# Patient Record
Sex: Female | Born: 1990 | Race: White | Hispanic: No | Smoking: Never smoker
Health system: Southern US, Community
[De-identification: ages and names within clinical notes are randomized; demographics above are authoritative.]

## PROBLEM LIST (undated history)

## (undated) DIAGNOSIS — M419 Scoliosis, unspecified: Secondary | ICD-10-CM

## (undated) DIAGNOSIS — Z3490 Encounter for supervision of normal pregnancy, unspecified, unspecified trimester: Secondary | ICD-10-CM

## (undated) DIAGNOSIS — F410 Panic disorder [episodic paroxysmal anxiety] without agoraphobia: Secondary | ICD-10-CM

## (undated) DIAGNOSIS — O24419 Gestational diabetes mellitus in pregnancy, unspecified control: Secondary | ICD-10-CM

## (undated) HISTORY — PX: OTHER SURGICAL HISTORY: SHX169

## (undated) HISTORY — DX: Scoliosis, unspecified: M41.9

## (undated) HISTORY — DX: Encounter for supervision of normal pregnancy, unspecified, unspecified trimester: Z34.90

## (undated) HISTORY — DX: Gestational diabetes mellitus in pregnancy, unspecified control: O24.419

---
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
## 2008-03-17 ENCOUNTER — Inpatient Hospital Stay (HOSPITAL_COMMUNITY): Admission: AD | Admit: 2008-03-17 | Discharge: 2008-03-17 | Payer: Self-pay | Admitting: Obstetrics & Gynecology

## 2008-03-26 ENCOUNTER — Inpatient Hospital Stay (HOSPITAL_COMMUNITY): Admission: AD | Admit: 2008-03-26 | Discharge: 2008-03-28 | Payer: Self-pay | Admitting: Obstetrics & Gynecology

## 2008-03-26 ENCOUNTER — Ambulatory Visit: Payer: Self-pay | Admitting: Advanced Practice Midwife

## 2008-08-16 ENCOUNTER — Emergency Department (HOSPITAL_COMMUNITY): Admission: EM | Admit: 2008-08-16 | Discharge: 2008-08-16 | Payer: Self-pay | Admitting: Emergency Medicine

## 2008-12-26 ENCOUNTER — Inpatient Hospital Stay (HOSPITAL_COMMUNITY): Admission: AD | Admit: 2008-12-26 | Discharge: 2008-12-26 | Payer: Self-pay | Admitting: Obstetrics & Gynecology

## 2009-07-07 ENCOUNTER — Ambulatory Visit: Payer: Self-pay | Admitting: Family

## 2009-07-07 ENCOUNTER — Inpatient Hospital Stay (HOSPITAL_COMMUNITY): Admission: AD | Admit: 2009-07-07 | Discharge: 2009-07-07 | Payer: Self-pay | Admitting: Obstetrics & Gynecology

## 2009-08-11 ENCOUNTER — Inpatient Hospital Stay (HOSPITAL_COMMUNITY): Admission: AD | Admit: 2009-08-11 | Discharge: 2009-08-15 | Payer: Self-pay | Admitting: Obstetrics and Gynecology

## 2009-08-11 ENCOUNTER — Ambulatory Visit: Payer: Self-pay | Admitting: Obstetrics and Gynecology

## 2009-08-12 DIAGNOSIS — O321XX0 Maternal care for breech presentation, not applicable or unspecified: Secondary | ICD-10-CM

## 2009-10-05 ENCOUNTER — Emergency Department (HOSPITAL_COMMUNITY): Admission: EM | Admit: 2009-10-05 | Discharge: 2009-10-05 | Payer: Self-pay | Admitting: Emergency Medicine

## 2009-10-08 ENCOUNTER — Emergency Department (HOSPITAL_COMMUNITY): Admission: EM | Admit: 2009-10-08 | Discharge: 2009-10-08 | Payer: Self-pay | Admitting: Emergency Medicine

## 2009-10-16 ENCOUNTER — Encounter: Payer: Self-pay | Admitting: Orthopedic Surgery

## 2009-11-12 ENCOUNTER — Emergency Department (HOSPITAL_COMMUNITY): Admission: EM | Admit: 2009-11-12 | Discharge: 2009-11-12 | Payer: Self-pay | Admitting: Emergency Medicine

## 2010-01-01 ENCOUNTER — Emergency Department (HOSPITAL_COMMUNITY): Admission: EM | Admit: 2010-01-01 | Discharge: 2010-01-01 | Payer: Self-pay | Admitting: Emergency Medicine

## 2010-05-05 NOTE — Letter (Signed)
Summary: *Orthopedic No Show Letter  [facility]  [address]  [place], [place] [address]   Phone: [phone]  Fax: [phone]      10/16/2009   [name] [address] [place], [place]  [address]      Dear [name],   Our records indicate that you missed your scheduled appointment with Dr. Beaulah Corin on 10/13/2009, following [name] Emergency [facility] visit.  Please contact this office to reschedule your appointment as soon as possible.  It is important that you keep your scheduled appointments with your physician, so we can provide you the best care possible.        Sincerely,    Dr. Terrance Mass, MD [facility] Phone [phone]

## 2010-06-18 LAB — POCT PREGNANCY, URINE: Preg Test, Ur: POSITIVE

## 2010-06-18 LAB — URINALYSIS, ROUTINE W REFLEX MICROSCOPIC
Bilirubin Urine: NEGATIVE
Hgb urine dipstick: NEGATIVE
Protein, ur: NEGATIVE mg/dL
Urobilinogen, UA: 0.2 mg/dL (ref 0.0–1.0)

## 2010-06-19 LAB — URINALYSIS, ROUTINE W REFLEX MICROSCOPIC
Bilirubin Urine: NEGATIVE
Glucose, UA: NEGATIVE mg/dL
Ketones, ur: NEGATIVE mg/dL
Nitrite: NEGATIVE
Protein, ur: NEGATIVE mg/dL
pH: 6 (ref 5.0–8.0)

## 2010-06-19 LAB — GC/CHLAMYDIA PROBE AMP, GENITAL: Chlamydia, DNA Probe: NEGATIVE

## 2010-06-19 LAB — WET PREP, GENITAL
Clue Cells Wet Prep HPF POC: NONE SEEN
Trich, Wet Prep: NONE SEEN

## 2010-06-19 LAB — HCG, QUANTITATIVE, PREGNANCY: hCG, Beta Chain, Quant, S: 41 m[IU]/mL — ABNORMAL HIGH (ref <5)

## 2010-06-23 LAB — CBC
Hemoglobin: 12.6 g/dL (ref 12.0–15.0)
MCHC: 34.5 g/dL (ref 30.0–36.0)
MCV: 89.8 fL (ref 78.0–100.0)
Platelets: 114 10*3/uL — ABNORMAL LOW (ref 150–400)
RBC: 3.05 MIL/uL — ABNORMAL LOW (ref 3.87–5.11)
RDW: 13.1 % (ref 11.5–15.5)
WBC: 8.3 10*3/uL (ref 4.0–10.5)

## 2010-06-23 LAB — CCBB MATERNAL DONOR DRAW

## 2010-06-23 LAB — RPR: RPR Ser Ql: NONREACTIVE

## 2010-07-10 LAB — ABO/RH: ABO/RH(D): O POS

## 2010-07-10 LAB — HCG, QUANTITATIVE, PREGNANCY: hCG, Beta Chain, Quant, S: 6527 m[IU]/mL — ABNORMAL HIGH (ref <5)

## 2010-08-17 ENCOUNTER — Inpatient Hospital Stay (HOSPITAL_COMMUNITY)
Admission: AD | Admit: 2010-08-17 | Discharge: 2010-08-19 | DRG: 775 | Disposition: A | Discharge status: Home / Self Care | Payer: Medicaid Other | Source: Ambulatory Visit | Attending: Family Medicine | Admitting: Family Medicine

## 2010-08-17 DIAGNOSIS — O34219 Maternal care for unspecified type scar from previous cesarean delivery: Principal | ICD-10-CM | POA: Diagnosis present

## 2010-08-18 DIAGNOSIS — O24419 Gestational diabetes mellitus in pregnancy, unspecified control: Secondary | ICD-10-CM

## 2010-08-18 DIAGNOSIS — O34219 Maternal care for unspecified type scar from previous cesarean delivery: Secondary | ICD-10-CM

## 2010-08-18 LAB — CBC
Platelets: 143 10*3/uL — ABNORMAL LOW (ref 150–400)
RBC: 4.19 MIL/uL (ref 3.87–5.11)
RDW: 14.1 % (ref 11.5–15.5)
WBC: 9.8 10*3/uL (ref 4.0–10.5)

## 2010-08-18 LAB — RPR: RPR Ser Ql: NONREACTIVE

## 2010-08-21 ENCOUNTER — Other Ambulatory Visit (HOSPITAL_COMMUNITY): Payer: Self-pay

## 2010-08-21 NOTE — H&P (Signed)
NAMELENDORA, [name] NO.:  [id number]   MEDICAL RECORD NO.:  [id number]          PATIENT TYPE:  INP   LOCATION:  A310                          FACILITY:  [facility]   PHYSICIAN:  Francoise Schaumann. Halm, DO, FAAP, FACOPDATE OF BIRTH:  1990/06/26   DATE OF ADMISSION:  07/01/2004  DATE OF DISCHARGE:  [facility]                                HISTORY & PHYSICAL   CHIEF COMPLAINT:  Abdominal pain.   HISTORY OF PRESENT ILLNESS:  The patient is a 20 year old female who  presents as an unassigned patient to the ED for the second time in two days  with a complaint of epigastric and right upper quadrant abdominal  discomfort.  On her initial evaluation she had a CT scan without contrast  which failed to reveal any pathology.  Her laboratory studies at that ER  visit were normal.  She came back the following day which was the day of  admission with worsening abdominal cramps and initiation of vomiting.  She  has had no lower GI symptoms, no GYN problems.   In the ED on her second visit she had a CT scan with contrast which failed  to show any appendicitis or significant abdominal pathology.  Her laboratory  screening tests were also normal including a normal lipase and amylase.   I was contacted by Dr. Margretta Jacobson regarding admission for monitoring this  patient with recurrent ED visits.   PAST MEDICAL HISTORY:  Previous hospitalization for childbirth approximately  3-4 months ago which was vaginal and uncomplicated.  No prior surgeries.   SOCIAL HISTORY:  The patient lives with her immediate family and her father  is present in the ED.   FAMILY HISTORY:  Negative for any current illnesses similar to hers.   CURRENT MEDICATIONS:  No regular medications.   ALLERGIES:  No known drug allergies.   REVIEW OF SYSTEMS:  The patient has had abdominal pain which seems to be  characterized by right upper quadrant pain without radiation other than  tenderness to the flank area.  She has no dysuria,  no GYN symptoms or  vaginal discharge.  She has had no significant fever or chills.  No unusual  rashes or arthritis symptoms.  She has had no significant headache.  She did  have a viral laryngitis approximately one week ago which she recovered from  without any problems.   PHYSICAL EXAMINATION:  VITAL SIGNS:  Initial temperature upon arrival to the  floor was 97.8, pulse 70, respirations 20, blood pressure 122/76, O2  saturation is 97% on room air.  GENERAL:  This is an attractive young female who is in moderate distress due  to abdominal pain and vomiting.  He is very cooperative with my examination.  HEENT:  Her head and neck evaluation shows normal thyroid gland with no  carotid bruit.  She has moist, mucous membranes.  LUNGS:  Clear in both fields.  She has good breath sounds.  HEART:  Regular without any significant murmurs.  ABDOMEN:  Soft and flat and somewhat tender in the right upper quadrant.  There is no rebound  tenderness.  I was unable to hear any significant bowel  sounds.  She also does have some palpable flank tenderness on the right side  across the right costal margin region.  She had a GYN evaluation on the  night prior to admission which showed a negative vaginal smear.  EXTREMITIES:  Show no edema or rash.   LABORATORY DATA:  Electrolytes are all normal.  Her creatinine is normal.  Her urinalysis is completely normal.  Her CBC shows a white count of 6400  with a normal differential, again without a left shift.  Her platelets are  229,000, her hemoglobin is 12.1.   Her CT scan shows small amount of free pelvic fluid, mild constipation which  may or may not be significant and she has no evidence of appendicitis.   IMPRESSION:  1.  Abdominal pain.  2.  Vomiting.  3.  Musculoskeletal pain is in differential.   PLAN:  The plan will be to admit to the hospital for IV hydration, pain  management.  If her symptoms persist she will probably need an ultrasound of   the right upper quadrant to rule out cholecystitis.  Other potential  etiologies of her pain include constipation, occult pancreatitis.  We will  follow up with repeat laboratory studies including an amylase and lipase in  the morning.   I have reviewed the care plan with the patient and her father and they are  both in agreement with our course of action.      [facility]/MEDQ  D:  07/02/2004  T:  07/02/2004  Job:  [id number]

## 2010-08-21 NOTE — H&P (Signed)
NAMEASUKA, DUSSEAU NO.:  [id number]   MEDICAL RECORD NO.:  [id number]          PATIENT TYPE:  INP   LOCATION:  9167                          FACILITY:  [facility]   PHYSICIAN:  Genia Del, M.D.DATE OF BIRTH:  [birth year]   DATE OF ADMISSION:  02/07/2004  DATE OF DISCHARGE:                                HISTORY & PHYSICAL   Ms. [name] is a 20 year old G1, 39 weeks, expected date of delivery  February 13, 2004.   REASON FOR ADMISSION:  Induction for small for gestational age, fourth  percentile, with borderline increased blood pressure.   HISTORY OF PRESENT ILLNESS:  Fetal movements positive.  No regular uterine  contractions.  No fluid leak.  No vaginal bleeding.  The patient presented  to [facility] yesterday for her return OB visit.  An ultrasound was  done to follow up on growth because of a borderline small for gestational  age baby.  The ultrasound showed an estimated fetal weight at the fourth  percentile, the abdominal circumference was at the first percentile.  Amniotic fluid 75th percentile.  Dopplers were normal and biophysical  profile was 8 out of 8.  Cephalic presentation.  Because the pregnancy was  at term with a small for gestational age baby, the decision was taken to  induce the patient.  Also her blood pressure at the office was 122/90 and  122/98 but proteins were negative in urine, and she had no PIH symptoms.   PAST MEDICAL HISTORY:  Asthma.   PAST SURGICAL HISTORY:  Bladder surgery as an infant.   FAMILY HISTORY:  Positive for hypertension.   MEDICATIONS:  Prenatal vitamins.   No known drug allergies.   SOCIAL HISTORY:  Single.  Living with her father.  Baby for adoption with  agency [facility].   HISTORY OF PRESENT PREGNANCY:  Late prenatal care.  First visit at our  office at 28+ weeks.  Labs showed hemoglobin at 12.5, platelets 172.  Blood  type Rh O positive.  Rh antibody negative.  Toxo negative.  RPR  nonreactive.  HB __________  nonreactive.  HIV nonreactive.  Rubella immune.  Urinalysis  and culture are negative.  Pap test within normal limits.  Ultrasound,  review of anatomy was within normal limits at 29+ weeks.  Dating  corresponded.  Amniotic fluid was normal.  Placenta was posterior normal.  A  one hour GTT was within normal limits.  Group B strep was done at 35 weeks  and was negative.  A repeat ultrasound at 36+ weeks showed the 12th  percentile estimated fetal weight and a normal amniotic fluid index.  Blood  pressures remained normal until the last visit where the diastolics were at  90 and 98.   REVIEW OF SYSTEMS:  CONSTITUTIONAL:  Negative.  HEENT:  Negative.  CARDIOVASCULAR/RESPIRATORY:  Negative.  GI/URO:  Negative.  DERMATO/NEURO/ENDOCRINE:  Negative.   PHYSICAL EXAMINATION:  GENERAL:  No apparent distress.  VITAL SIGNS:  Stable.  Blood pressures in the 120s over 70s.  Afebrile.  Regular pulse.  LUNGS:  Clear bilaterally.  HEART:  Regular  cardiac rhythm.  ABDOMEN:  Gravid.  Cephalic presentation.  PELVIC:  Vaginal exam, on admission, cervix 2+-cm dilated, 85-90% effaced,  vertex -1, membranes intact.  EXTREMITIES:  Lower limbs normal.   Monitoring fetal heart rate 140s, reactive, no decelerations, no regular  uterine contractions.   IMPRESSION:  1.  Gravida 1 at 57 weeks teenager with small for gestational age fourth      percentile.  2.  Borderline increased blood pressures with no evidence of preeclampsia.   PLAN:  Admit to labor and delivery for induction, low dose Pitocin,  artifical rupture of membranes, continuous monitoring, expected vaginal  delivery.  Baby for adoption.      ML/MEDQ  D:  02/07/2004  T:  02/07/2004  Job:  [id number]

## 2010-08-21 NOTE — Discharge Summary (Signed)
[name], [name] NO.:  [id number]   MEDICAL RECORD NO.:  [id number]          PATIENT TYPE:  INP   LOCATION:  A310                          FACILITY:  [facility]   PHYSICIAN:  Francoise Schaumann. Halm, DO, FAAP, FACOPDATE OF BIRTH:  06-06-1990   DATE OF ADMISSION:  07/01/2004  DATE OF DISCHARGE:  03/31/2006[facility]                                 DISCHARGE SUMMARY   FINAL DIAGNOSES:  1.  Abdominal pain, nonspecific.  2.  Vomiting.   BRIEF HISTORY:  The patient presented as a 20 year old unassigned patient  through the emergency department with a two day history of epigastric and  right upper quadrant discomfort.  She had a CT scan of this region which  failed to reveal any pathology.  Her laboratory studies were normal in the  emergency department.  She came back a second time to the emergency  department with worsening abdominal cramps and the onset of vomiting.  She  was admitted to the hospital for further management.  Of note is she had a  previous childbirth experience about four months prior to admission without  any complications.   HOSPITAL COURSE:  As noted, the patient had a normal initial CT scan of the  abdomen.  She had a repeat CT scan with oral contrast focused on the  appendix which failed to reveal any evidence of appendicitis.   While in the hospital the patient had continued pain which really focused on  the right costal margin edge and was exacerbated when her rib cage was  compressed.  She did have some mild right upper quadrant soft tissue  tenderness while in the hospital.  It was overall felt that her pain was  musculoskeletal, particularly given her normal CT scan studies and normal  laboratory evaluation.  Also, her urinalysis was unremarkable as were both  an amylase and lipase.   The patient was placed on nonsteroidal anti-inflammatories as well as muscle  relaxants.  She had fairly good improvement in her pain acuity.  It was felt  that on the day  of discharge she was stable to be sent home and continue her  medication regimen.   DISPOSITION:  The patient was discharged in stable condition and was given  prescriptions for Naprosyn 500 mg p.o. b.i.d. as needed and Flexeril 10 mg  q.8h. PRN muscle spasm as well as Phenergan 12.5 mg one to two tablets every  6 hours as needed.  We asked that this patient come to our office in follow  up for repeat examination in one week.     [facility]/MEDQ  D:  08/25/2004  T:  08/25/2004  Job:  [id number]

## 2010-08-28 ENCOUNTER — Inpatient Hospital Stay (HOSPITAL_COMMUNITY): Admission: RE | Admit: 2010-08-28 | Payer: Self-pay | Source: Ambulatory Visit | Admitting: Obstetrics & Gynecology

## 2010-11-05 ENCOUNTER — Encounter: Payer: Self-pay | Admitting: *Deleted

## 2010-11-05 ENCOUNTER — Emergency Department (HOSPITAL_COMMUNITY)
Admission: EM | Admit: 2010-11-05 | Discharge: 2010-11-05 | Disposition: A | Discharge status: Home / Self Care | Payer: Medicaid Other | Attending: Emergency Medicine | Admitting: Emergency Medicine

## 2010-11-05 DIAGNOSIS — M549 Dorsalgia, unspecified: Secondary | ICD-10-CM

## 2010-11-05 MED ORDER — CYCLOBENZAPRINE HCL 10 MG PO TABS: 10.0000 mg | ORAL_TABLET | Freq: Three times a day (TID) | ORAL | Status: AC | PRN

## 2010-11-05 MED ORDER — OXYCODONE-ACETAMINOPHEN 5-325 MG PO TABS: 1.0000 | ORAL_TABLET | ORAL | Status: AC | PRN

## 2010-11-05 NOTE — ED Notes (Signed)
C/o back pain after picking dog up in yard yesterday. Dog attempted to jump out of her arms and now c/o back pain worse with movement. Denies any difficulties with bowel/bladder.

## 2010-11-05 NOTE — ED Provider Notes (Signed)
History     CSN: [id number] Arrival date & time: 11/05/2010  2:23 PM  Chief Complaint  Patient presents with  . Back Pain   HPI Comments: Patent c/o aching pain to the her mid back after she picked up her 80 pound dog yesterday.  Pain has been persistent since that time.  She denies fall, incontinence of urine or feces, weakness or numbness.    Patient is a 20 y.o. female presenting with back pain. The history is provided by the patient.  Back Pain  This is a new problem. The current episode started yesterday. The problem occurs constantly. The problem has not changed since onset.The pain is associated with lifting heavy objects and twisting. The pain is present in the thoracic spine and lumbar spine. The quality of the pain is described as aching. The pain does not radiate. The pain is moderate. The symptoms are aggravated by bending, twisting and certain positions. The pain is the same all the time. Pertinent negatives include no chest pain, no fever, no numbness, no headaches, no abdominal pain, no abdominal swelling, no bowel incontinence, no perianal numbness, no bladder incontinence, no dysuria, no leg pain, no paresis, no tingling and no weakness. She has tried NSAIDs for the symptoms. The treatment provided mild relief.    History reviewed. No pertinent past medical history.  Past Surgical History  Procedure Date  . Urethra stretched   . Cesarean section     History reviewed. No pertinent family history.  History  Substance Use Topics  . Smoking status: Never Smoker   . Smokeless tobacco: Not on file  . Alcohol Use: No    OB History    Grav Para Term Preterm Abortions TAB SAB Ect Mult Living                  Review of Systems  Constitutional: Negative for fever and appetite change.  HENT: Negative for neck pain and neck stiffness.   Respiratory: Negative for cough, chest tightness and shortness of breath.   Cardiovascular: Negative.  Negative for chest pain.    Gastrointestinal: Negative.  Negative for abdominal pain and bowel incontinence.  Genitourinary: Negative for bladder incontinence, dysuria, frequency, hematuria, flank pain and vaginal pain.  Musculoskeletal: Positive for back pain. Negative for myalgias, joint swelling, arthralgias and gait problem.  Skin: Negative.   Neurological: Negative for dizziness, tingling, weakness, numbness and headaches.  Hematological: Does not bruise/bleed easily.    Physical Exam  BP 113/76  Pulse 85  Temp(Src) 98.5 F (36.9 C) (Oral)  Resp 16  SpO2 100%  LMP 09/04/2010  Physical Exam  Nursing note and vitals reviewed. Constitutional: She is oriented to person, place, and time. She appears well-developed and well-nourished.  HENT:  Head: Normocephalic and atraumatic.  Mouth/Throat: Oropharynx is clear and moist.  Neck: Normal range of motion. Neck supple.  Cardiovascular: Normal rate, regular rhythm and normal heart sounds.   Pulmonary/Chest: Effort normal and breath sounds normal.  Abdominal: There is no tenderness. There is no rebound and no guarding.  Musculoskeletal: She exhibits tenderness. She exhibits no edema.       Lumbar back: She exhibits tenderness. She exhibits normal range of motion, no swelling and normal pulse.       Back:  Lymphadenopathy:    She has no cervical adenopathy.  Neurological: She is oriented to person, place, and time. She has normal reflexes. She exhibits normal muscle tone. Coordination normal.  Skin: Skin is warm.  ED Course  Procedures  MDM   PAtient is ambulatory, NAD.  Moves all extremites well.  ttp of the thoracolumbar region and paraspinal muscles.  No focal neuro deficits or weakness, no fall.  Likely muscular strain        Andrew Soria L. Madesyn Ast, [place] 11/05/10 1529

## 2010-11-05 NOTE — ED Notes (Signed)
Pt c/o pain in her mid back since last night. Pt states that it started after picking up her 80 lb. Dog last night. Pt states that it hurts worse when she moves.

## 2010-11-07 NOTE — ED Provider Notes (Signed)
Medical screening examination/treatment/procedure(s) were performed by non-physician practitioner and as supervising physician I was immediately available for consultation/collaboration.  Marcena Dias W Kanaan Kagawa, MD 11/07/10 1815 

## 2010-12-11 ENCOUNTER — Encounter (HOSPITAL_COMMUNITY): Payer: Self-pay | Admitting: *Deleted

## 2010-12-11 ENCOUNTER — Emergency Department (HOSPITAL_COMMUNITY)
Admission: EM | Admit: 2010-12-11 | Discharge: 2010-12-11 | Disposition: A | Discharge status: Home / Self Care | Payer: Medicaid Other | Attending: Emergency Medicine | Admitting: Emergency Medicine

## 2010-12-11 ENCOUNTER — Emergency Department (HOSPITAL_COMMUNITY): Payer: Medicaid Other

## 2010-12-11 ENCOUNTER — Other Ambulatory Visit: Payer: Self-pay

## 2010-12-11 DIAGNOSIS — F411 Generalized anxiety disorder: Secondary | ICD-10-CM | POA: Insufficient documentation

## 2010-12-11 DIAGNOSIS — R42 Dizziness and giddiness: Secondary | ICD-10-CM | POA: Insufficient documentation

## 2010-12-11 DIAGNOSIS — H8110 Benign paroxysmal vertigo, unspecified ear: Secondary | ICD-10-CM | POA: Insufficient documentation

## 2010-12-11 DIAGNOSIS — R002 Palpitations: Secondary | ICD-10-CM | POA: Insufficient documentation

## 2010-12-11 DIAGNOSIS — R209 Unspecified disturbances of skin sensation: Secondary | ICD-10-CM | POA: Insufficient documentation

## 2010-12-11 DIAGNOSIS — R0602 Shortness of breath: Secondary | ICD-10-CM | POA: Insufficient documentation

## 2010-12-11 DIAGNOSIS — R079 Chest pain, unspecified: Secondary | ICD-10-CM | POA: Insufficient documentation

## 2010-12-11 DIAGNOSIS — R110 Nausea: Secondary | ICD-10-CM | POA: Insufficient documentation

## 2010-12-11 LAB — CBC
Hemoglobin: 13.3 g/dL (ref 12.0–15.0)
MCH: 27 pg (ref 26.0–34.0)
Platelets: 234 10*3/uL (ref 150–400)
RBC: 4.92 MIL/uL (ref 3.87–5.11)
WBC: 6.4 10*3/uL (ref 4.0–10.5)

## 2010-12-11 LAB — BASIC METABOLIC PANEL
BUN: 13 mg/dL (ref 6–23)
CO2: 25 mEq/L (ref 19–32)
Chloride: 107 mEq/L (ref 96–112)
Creatinine, Ser: 0.74 mg/dL (ref 0.50–1.10)
Glucose, Bld: 104 mg/dL — ABNORMAL HIGH (ref 70–99)
Potassium: 3.8 mEq/L (ref 3.5–5.1)

## 2010-12-11 MED ORDER — ONDANSETRON 8 MG PO TBDP
8.0000 mg | ORAL_TABLET | Freq: Once | ORAL | Status: AC
  Administered 2010-12-11: 8 mg via ORAL
  Filled 2010-12-11: qty 1

## 2010-12-11 MED ORDER — MECLIZINE HCL 25 MG PO TABS: 25.0000 mg | ORAL_TABLET | Freq: Three times a day (TID) | ORAL | Status: AC | PRN

## 2010-12-11 MED ORDER — LORAZEPAM 1 MG PO TABS
1.0000 mg | ORAL_TABLET | Freq: Once | ORAL | Status: AC
  Administered 2010-12-11: 1 mg via ORAL
  Filled 2010-12-11: qty 1

## 2010-12-11 NOTE — ED Provider Notes (Signed)
History     CSN: [id number] Arrival date & time: 12/11/2010 12:11 PM Pt seen at 1300 Chief Complaint  Patient presents with  . Dizziness  . Numbness    RUE  . Chest Pain    with lying flat-pressure   Patient is a 20 y.o. female presenting with chest pain. The history is provided by the patient and a relative.  Chest Pain The chest pain began 6 - 12 hours ago. Chest pain occurs frequently. The chest pain is improving. Exacerbated by: lying flat. Primary symptoms include shortness of breath, palpitations, nausea and dizziness. Pertinent negatives for primary symptoms include no fever, no fatigue, no syncope, no cough, no wheezing, no abdominal pain, no vomiting and no altered mental status.  The palpitations also occurred with dizziness and shortness of breath.   Dizziness also occurs with nausea. Dizziness does not occur with vomiting or weakness.  Associated symptoms include numbness.  Pertinent negatives for associated symptoms include no lower extremity edema and no weakness.    Pt went to bed feeling well last night.  Woke up this AM to make bottle for child, and felt very dizzy, then felt heart racing, then felt tightness in right chest only while lying flat and it will improve upon sitting up.  CP is not increased with deep breathing.  She reports while lying flat her chest is tight and "feels like I'm gonna die" and world starts spinning while lying flat.  She admits to frequent panic attacks in past, but this episode is worse with dizziness and chest tightness Reports numbness to right forearm only, no arm weakness, no facial weakness, no seizure reported No syncope No vomiting/diarrhea No vag bleeding No fever/illnessess recently She is 3 months postpartum, uncomplicated vaginal delivery, she is not breastfeeding.   No meds No h/o syncope in past No recent travel or surgery No h/o DVT/PE No fam h/o CAD/sudden death at young age Pt denies wt gain, denies dyspnea on exertion,  denies LE edema since child birth History reviewed. No pertinent past medical history.  Past Surgical History  Procedure Date  . Urethra stretched   . Cesarean section     No family history on file.  History  Substance Use Topics  . Smoking status: Never Smoker   . Smokeless tobacco: Not on file  . Alcohol Use: No    OB History    Grav Para Term Preterm Abortions TAB SAB Ect Mult Living                  Review of Systems  Constitutional: Negative for fever and fatigue.  Respiratory: Positive for shortness of breath. Negative for cough and wheezing.   Cardiovascular: Positive for chest pain and palpitations. Negative for syncope.  Gastrointestinal: Positive for nausea. Negative for vomiting and abdominal pain.  Neurological: Positive for dizziness and numbness. Negative for weakness.  Psychiatric/Behavioral: Negative for altered mental status.  All other systems reviewed and are negative.    Physical Exam  BP 133/90  Pulse 79  Temp(Src) 97.7 F (36.5 C) (Oral)  Resp 20  Ht 5\' 5"  (1.651 m)  Wt 158 lb (71.668 kg)  BMI 26.29 kg/m2  SpO2 100%  Physical Exam  CONSTITUTIONAL: Well developed/well nourished HEAD AND FACE: Normocephalic/atraumatic, very anxious EYES: EOMI/PERRL ENMT: Mucous membranes moist NECK: supple no meningeal signs, no carotid bruit CV: S1/S2 noted, no murmurs/rubs/gallops noted LUNGS: Lungs are clear to auscultation bilaterally, no apparent distress ABDOMEN: soft, nontender, no rebound or guarding NEURO: Pt  is awake/alert, moves all extremitiesx4, no focal motor deficit, reports numbness to right forearm only Gait normal No pastpointing Pt reports severe dizziness while lying supine that quickly corrects on sitting up EXTREMITIES: pulses normal, full ROM, no cyanosis SKIN: warm, color normal PSYCH: anxious   ED Course  Procedures  MDM Nursing notes reviewed and considered in documentation All labs/vitals reviewed and considered    Date: 12/11/2010  Rate: 80  Rhythm: normal sinus rhythm  QRS Axis: normal  Intervals: normal  ST/T Wave abnormalities: nonspecific ST changes, inverted T waves  Conduction Disutrbances:none  Narrative Interpretation:   Old EKG Reviewed: none available  Pt with severe anxiety, and dizziness/chest tightness only with lying flat.  No syncope.  No other CP symptoms, doubt ACS at this time, doubt pericarditis.  Doubt PE at this time     3:06 PM Pt improved, resting comfortably, vitals appropriate, I placed pt in a supine position and symptoms did not return.  I feel she is safe for d/c  Discussed strict return precautions  Joya Gaskins, MD 12/11/10 [phone]

## 2010-12-11 NOTE — ED Notes (Signed)
Pt states that her dizziness is better now. Pt dozing at intervals.

## 2010-12-11 NOTE — ED Notes (Signed)
Pt gets very dizzy with position changes. No further c/o nausea at this time.

## 2010-12-11 NOTE — ED Notes (Signed)
C/o onset of dizziness 0500 this am; c/o right sided chest pressure with lying flat only; states, "I feel like I'm going to die"; c/o intermittent RUE numbness; c/o right sided posterior neck pressure

## 2010-12-13 ENCOUNTER — Emergency Department (HOSPITAL_COMMUNITY)
Admission: EM | Admit: 2010-12-13 | Discharge: 2010-12-13 | Disposition: A | Discharge status: Home / Self Care | Payer: Medicaid Other | Attending: Emergency Medicine | Admitting: Emergency Medicine

## 2010-12-13 ENCOUNTER — Encounter (HOSPITAL_COMMUNITY): Payer: Self-pay

## 2010-12-13 DIAGNOSIS — R110 Nausea: Secondary | ICD-10-CM | POA: Insufficient documentation

## 2010-12-13 DIAGNOSIS — H8110 Benign paroxysmal vertigo, unspecified ear: Secondary | ICD-10-CM

## 2010-12-13 DIAGNOSIS — R42 Dizziness and giddiness: Secondary | ICD-10-CM | POA: Insufficient documentation

## 2010-12-13 DIAGNOSIS — R51 Headache: Secondary | ICD-10-CM | POA: Insufficient documentation

## 2010-12-13 DIAGNOSIS — M79609 Pain in unspecified limb: Secondary | ICD-10-CM | POA: Insufficient documentation

## 2010-12-13 DIAGNOSIS — R0789 Other chest pain: Secondary | ICD-10-CM | POA: Insufficient documentation

## 2010-12-13 MED ORDER — LORAZEPAM 1 MG PO TABS
1.0000 mg | ORAL_TABLET | Freq: Once | ORAL | Status: AC
  Administered 2010-12-13: 1 mg via ORAL
  Filled 2010-12-13: qty 1

## 2010-12-13 MED ORDER — MECLIZINE HCL 12.5 MG PO TABS
25.0000 mg | ORAL_TABLET | Freq: Once | ORAL | Status: AC
  Administered 2010-12-13: 25 mg via ORAL
  Filled 2010-12-13: qty 2

## 2010-12-13 NOTE — ED Provider Notes (Signed)
History     CSN: [id number] Arrival date & time: 12/13/2010  2:13 AM  Chief Complaint  Patient presents with  . Arm Pain  . Chest Pain    worse when laying flat x 2 days  . Dizziness    since 5pm yesterday  . Nausea  . Headache    right side off/on since around 6pm   Patient is a 20 y.o. female presenting with arm pain, chest pain, and headaches. The history is provided by the patient.  Arm Pain This is a recurrent problem. The current episode started 6 to 12 hours ago. Episode frequency: intermittently. The problem has not changed since onset.Associated symptoms include chest pain. Pertinent negatives include no abdominal pain and no shortness of breath. Exacerbated by: position. Relieved by: position. She has tried nothing for the symptoms.  Chest Pain Pertinent negatives for primary symptoms include no fever, no shortness of breath, no wheezing, no abdominal pain and no vomiting.  Associated symptoms include numbness.    Headache  Pertinent negatives include no fever, no shortness of breath and no vomiting.  PT returns for her vertigo symptoms, she was seen here a few days ago for the same and was feeling better so she dis not fell her RX for antivert and now has recurrent symptoms.  She is very anxious about getting dizzy when she lays down and then she gets R sided CP and R arm numbness.  Currently she has no symptoms sitting up bed does not want to lay back for fear that she gets dizzy.  She denies any tob use, recent illness or recent sick contacts. mno ear pain or drainage, no sore throat, no seasonal allergies. No SOB or leg pain/ swelling  History reviewed. No pertinent past medical history.  Past Surgical History  Procedure Date  . Urethra stretched   . Cesarean section     No family history on file.  History  Substance Use Topics  . Smoking status: Never Smoker   . Smokeless tobacco: Not on file  . Alcohol Use: No    OB History    Grav Para Term Preterm  Abortions TAB SAB Ect Mult Living                  Review of Systems  Constitutional: Negative for fever and chills.  HENT: Negative for congestion, sore throat, neck pain, neck stiffness and voice change.   Eyes: Negative for pain.  Respiratory: Negative for chest tightness, shortness of breath and wheezing.   Cardiovascular: Positive for chest pain. Negative for leg swelling.  Gastrointestinal: Negative for vomiting and abdominal pain.  Genitourinary: Negative for dysuria.  Musculoskeletal: Negative for back pain.  Skin: Negative for rash.  Neurological: Positive for numbness.  Psychiatric/Behavioral:       Anxiety   All other systems reviewed and are negative.    Physical Exam  BP 100/70  Pulse 77  Temp 97.6 F (36.4 C)  Resp 20  Ht 5\' 5"  (1.651 m)  Wt 158 lb (71.668 kg)  BMI 26.29 kg/m2  SpO2 100%  Physical Exam  Constitutional: She is oriented to person, place, and time. She appears well-developed and well-nourished.  HENT:  Head: Normocephalic and atraumatic.  Right Ear: Tympanic membrane, external ear and ear canal normal.  Left Ear: Tympanic membrane, external ear and ear canal normal.  Eyes: Conjunctivae and EOM are normal. Pupils are equal, round, and reactive to light.  Neck: Trachea normal. Neck supple. No thyromegaly present.  Cardiovascular: Normal rate, regular rhythm, S1 normal, S2 normal and normal pulses.     No systolic murmur is present   No diastolic murmur is present  Pulses:      Radial pulses are 2+ on the right side, and 2+ on the left side.       No chest wall tenderness or rash or crepitus  Pulmonary/Chest: Effort normal and breath sounds normal. She has no wheezes. She has no rhonchi. She has no rales. She exhibits no tenderness.  Abdominal: Soft. Normal appearance and bowel sounds are normal. There is no tenderness. There is no CVA tenderness and negative Murphy's sign.  Musculoskeletal:       BLE:s Calves nontender, no cords or  erythema, negative Homans sign  Neurological: She is alert and oriented to person, place, and time. She has normal strength. No cranial nerve deficit or sensory deficit. GCS eye subscore is 4. GCS verbal subscore is 5. GCS motor subscore is 6.       Mild lateral nystagmus and reproducible symptoms with supine position.   Skin: Skin is warm and dry. No rash noted. She is not diaphoretic.  Psychiatric: Her speech is normal.       Cooperative and appropriate    ED Course  Procedures  MDM  Vertigo that is positional with no neuro deficits, PT very anxious and likely etiology for R sided CP and arm numbness, no cardiac risk factors and highly doubt concurrent ACS. PT given ativan and antivert in ED and she agrees to PCP follow up and agrees to fill her RX Antivert. Precautions given. Reliable historian stable for d/c home      [name], MD 12/13/10 [phone]

## 2010-12-13 NOTE — ED Notes (Signed)
1. Right arm pain 2. Chest pain x2 days, worse when laying flat 3. Dizziness 4. Headache, on right side of head. Off/on since 6pm Was seen in er for same friday

## 2010-12-13 NOTE — ED Notes (Signed)
Did not get meds filled as prescribed in er, was told she was having panic attacks

## 2011-01-06 LAB — URINALYSIS, ROUTINE W REFLEX MICROSCOPIC
Bilirubin Urine: NEGATIVE
Glucose, UA: NEGATIVE
Hgb urine dipstick: NEGATIVE
Specific Gravity, Urine: 1.025
pH: 6

## 2011-01-06 LAB — WET PREP, GENITAL
Clue Cells Wet Prep HPF POC: NONE SEEN
Yeast Wet Prep HPF POC: NONE SEEN

## 2011-01-06 LAB — CBC
HCT: 36.1
Hemoglobin: 12.4
MCHC: 34.3
RBC: 3.96
RDW: 13.7

## 2011-01-06 LAB — ANA: Anti Nuclear Antibody(ANA): NEGATIVE

## 2011-01-06 LAB — BASIC METABOLIC PANEL
CO2: 24
Calcium: 8.4
GFR calc Af Amer: >60
Glucose, Bld: 71
Potassium: 3.6
Sodium: 133 — ABNORMAL LOW

## 2011-01-06 LAB — PROTIME-INR: INR: 1.1

## 2011-01-06 LAB — GC/CHLAMYDIA PROBE AMP, GENITAL: Chlamydia, DNA Probe: NEGATIVE

## 2011-01-08 LAB — CBC
HCT: 36.7 % (ref 36.0–46.0)
Hemoglobin: 12.8 g/dL (ref 12.0–15.0)
MCHC: 34.8 g/dL (ref 30.0–36.0)
MCV: 91.1 fL (ref 78.0–100.0)
RBC: 4.03 MIL/uL (ref 3.87–5.11)
WBC: 9.7 10*3/uL (ref 4.0–10.5)

## 2011-01-08 LAB — URINALYSIS, ROUTINE W REFLEX MICROSCOPIC
Bilirubin Urine: NEGATIVE
Glucose, UA: NEGATIVE mg/dL
Hgb urine dipstick: NEGATIVE
Specific Gravity, Urine: >1.03 — ABNORMAL HIGH (ref 1.005–1.030)
Urobilinogen, UA: 0.2 mg/dL (ref 0.0–1.0)

## 2011-01-25 ENCOUNTER — Encounter (HOSPITAL_COMMUNITY): Payer: Self-pay

## 2011-01-25 ENCOUNTER — Emergency Department (HOSPITAL_COMMUNITY)
Admission: EM | Admit: 2011-01-25 | Discharge: 2011-01-25 | Disposition: A | Discharge status: Home / Self Care | Payer: Medicaid Other | Attending: Emergency Medicine | Admitting: Emergency Medicine

## 2011-01-25 DIAGNOSIS — F410 Panic disorder [episodic paroxysmal anxiety] without agoraphobia: Secondary | ICD-10-CM | POA: Insufficient documentation

## 2011-01-25 HISTORY — DX: Panic disorder (episodic paroxysmal anxiety): F41.0

## 2011-01-25 MED ORDER — ALPRAZOLAM 0.25 MG PO TABS: ORAL_TABLET | ORAL | Status: DC

## 2011-01-25 NOTE — ED Notes (Signed)
Pt states panic attacks started about a month ago; pt has been trying to find a PCP to follow up with tx however has been unable to find one to tx due to insurance acceptance. Panic attack this pm severe causing severe anxiety per patient; support and education provided to pt

## 2011-01-25 NOTE — ED Notes (Signed)
Pt reports every night having neck pain, then gets nauseated and dizzy, only happens at night.

## 2011-01-25 NOTE — ED Notes (Signed)
Pt stable and ambulatory at discharge.   

## 2011-01-26 NOTE — ED Provider Notes (Signed)
History     CSN: [id number] Arrival date & time: 01/25/2011  2:19 AM   First MD Initiated Contact with Patient 01/25/11 [phone]      Chief Complaint  Patient presents with  . Panic Attack    (Consider location/radiation/quality/duration/timing/severity/associated sxs/prior treatment) HPI Comments: Seen 0223. Patient a mother of 4 children, youngest 5 months who began having anxiety attacks shortly after her 4th chid was born. At first they were intermittent. Now have become nightly. When she is preparing for bed she begins to get anxious, has palpitation, often hyperventilates, experiences perioral numbness and tingling. She was seen on one previous occasion in the ER. She was encouraged at that time to follow up with [facility]). She had not yet done so.  Patient is a 20 y.o. female presenting with anxiety. The history is provided by the patient.  Anxiety This is a recurrent (Patient has been having anxiety attacks for about 5 months. She has them at night when she is preparing for bed. They make he feel like she is going to die.) problem. Episode onset: 5 months. The problem occurs daily. The problem has been gradually worsening. Associated symptoms include chest pain and shortness of breath. Associated symptoms comments: palpitations. The symptoms are aggravated by nothing. Relieved by: goes away after time. She has tried acetaminophen and a warm compress (hot bath, distracting activities) for the symptoms. The treatment provided no relief.    Past Medical History  Diagnosis Date  . Panic attacks   . Vertigo     Past Surgical History  Procedure Date  . Urethra stretched   . Cesarean section     No family history on file.  History  Substance Use Topics  . Smoking status: Never Smoker   . Smokeless tobacco: Not on file  . Alcohol Use: No    OB History    Grav Para Term Preterm Abortions TAB SAB Ect Mult Living                  Review of Systems    Respiratory: Positive for shortness of breath.        Hyperventilating  Cardiovascular: Positive for chest pain and palpitations.  Neurological: Positive for light-headedness and numbness.  All other systems reviewed and are negative.    Allergies  Review of patient's allergies indicates no known allergies.  Home Medications   Current Outpatient Rx  Name Route Sig Dispense Refill  . ALPRAZOLAM 0.25 MG PO TABS  Take one at the first signs of anxiety attack, repeat a second one if the attack is not over in 15 minutes. 20 tablet 0    BP 123/102  Pulse 95  Temp(Src) 98.4 F (36.9 C) (Oral)  Resp 20  Ht 5\' 5"  (1.651 m)  Wt 155 lb (70.308 kg)  BMI 25.79 kg/m2  SpO2 100%  Physical Exam  Nursing note and vitals reviewed. Constitutional: She is oriented to person, place, and time. She appears well-developed and well-nourished.       Anxious and tearful  HENT:  Head: Normocephalic and atraumatic.  Eyes: EOM are normal.  Neck: Normal range of motion. Neck supple.  Cardiovascular: Normal rate, normal heart sounds and intact distal pulses.   Pulmonary/Chest: Effort normal and breath sounds normal. No respiratory distress. She has no wheezes. She has no rales.  Abdominal: Soft. Bowel sounds are normal.  Musculoskeletal: Normal range of motion.  Neurological: She is alert and oriented to person, place, and time.  Skin: Skin  is warm and dry.  Psychiatric:       Anxious and tearaful    ED Course  Procedures (including critical care time)  Labs Reviewed - No data to display No results found.   1. Panic attack       MDM  Patient with recurrent anxiety attacks at night. Spent time discussing her stresses and concerns. Advised she follow up with [facility]. Gave her resource information.Pt stable in ED with no significant deterioration in condition.The patient appears reasonably screened and/or stabilized for discharge and I doubt any other medical condition or other [facility]  requiring further screening, evaluation, or treatment in the ED at this time prior to discharge. MDM Reviewed: nursing note, vitals and previous chart           Nicoletta Dress. Colon Branch, MD 01/26/11 1059

## 2011-02-26 ENCOUNTER — Emergency Department (HOSPITAL_COMMUNITY)
Admission: EM | Admit: 2011-02-26 | Discharge: 2011-02-26 | Disposition: A | Discharge status: Home / Self Care | Payer: Medicaid Other | Attending: Emergency Medicine | Admitting: Emergency Medicine

## 2011-02-26 ENCOUNTER — Encounter (HOSPITAL_COMMUNITY): Payer: Self-pay | Admitting: Emergency Medicine

## 2011-02-26 DIAGNOSIS — Z760 Encounter for issue of repeat prescription: Secondary | ICD-10-CM

## 2011-02-26 DIAGNOSIS — F419 Anxiety disorder, unspecified: Secondary | ICD-10-CM

## 2011-02-26 DIAGNOSIS — F411 Generalized anxiety disorder: Secondary | ICD-10-CM | POA: Insufficient documentation

## 2011-02-26 MED ORDER — ALPRAZOLAM 0.25 MG PO TABS: 0.2500 mg | ORAL_TABLET | Freq: Three times a day (TID) | ORAL | Status: AC | PRN

## 2011-02-26 NOTE — ED Notes (Signed)
Patient with no complaints at this time. Respirations even and unlabored. Skin warm/dry. Discharge instructions reviewed with patient at this time. Patient given opportunity to voice concerns/ask questions. Patient discharged at this time and left Emergency Department with steady gait.   

## 2011-02-26 NOTE — ED Notes (Signed)
Patient reports panic attacks and anxiety at night. Ran out of Xanax last night. Attempting to work this morning, boss states patient needs a work note that says its ok for her to work per patient. Patient denies being anxious at this time, but reports she is having "the after effects of anxiety" from last night. States she got minimal sleep and is just tired. Patient calm/cooperative at present. Denies any further needs. Awaiting PA evaluation.

## 2011-02-26 NOTE — ED Notes (Signed)
Pt c/o anxiety, dizziness since last night. Ran out of xanax last night and that was her last does. Pt is cooperative and calm at this time. No anxiousness observed at this time. Nad. Denies si/hi.

## 2011-02-28 NOTE — ED Provider Notes (Signed)
History     CSN: [id number] Arrival date & time: 02/26/2011  1:00 PM   First MD Initiated Contact with Patient 02/26/11 1307      Chief Complaint  Patient presents with  . Anxiety  . Medication Refill    (Consider location/radiation/quality/duration/timing/severity/associated sxs/prior treatment) Patient is a 20 y.o. female presenting with anxiety. The history is provided by the patient.  Anxiety This is a recurrent problem. The current episode started more than 1 month ago. The problem occurs intermittently. The problem has been unchanged (She describes palpitations,  feeling of doom and sometimes perioral tingling with sob.  Symptoms were greatly improved while on xanax,  but has run out of this medicine.  ). Pertinent negatives include no abdominal pain, anorexia, arthralgias, chest pain, chills, congestion, fatigue, fever, headaches, joint swelling, nausea, neck pain, numbness, rash, sore throat, vomiting or weakness. Associated symptoms comments: She denies suicidal ideation and depression.  She is unaware of any possible reason for these panic attacks.  She typically wakes at night with anxiety and panic - cannot recall any particularly disturbing dream that may have prompted the attack.  She has recognized that sirens will trigger an episode.. She has tried relaxation (xanax) for the symptoms. The treatment provided moderate relief.    Past Medical History  Diagnosis Date  . Panic attacks   . Vertigo     Past Surgical History  Procedure Date  . Urethra stretched   . Cesarean section     History reviewed. No pertinent family history.  History  Substance Use Topics  . Smoking status: Never Smoker   . Smokeless tobacco: Not on file  . Alcohol Use: No    OB History    Grav Para Term Preterm Abortions TAB SAB Ect Mult Living                  Review of Systems  Constitutional: Negative for fever, chills and fatigue.  HENT: Negative for congestion, sore throat and  neck pain.   Eyes: Negative.   Respiratory: Negative for chest tightness and shortness of breath.   Cardiovascular: Negative for chest pain.  Gastrointestinal: Negative for nausea, vomiting, abdominal pain and anorexia.  Genitourinary: Negative.   Musculoskeletal: Negative for joint swelling and arthralgias.  Skin: Negative.  Negative for rash and wound.  Neurological: Negative for dizziness, weakness, light-headedness, numbness and headaches.  Hematological: Negative.   Psychiatric/Behavioral: Positive for sleep disturbance. Negative for suicidal ideas, hallucinations, confusion, self-injury and agitation. The patient is nervous/anxious.     Allergies  Review of patient's allergies indicates no known allergies.  Home Medications   Current Outpatient Rx  Name Route Sig Dispense Refill  . ALPRAZOLAM 0.25 MG PO TABS  Take one at the first signs of anxiety attack, repeat a second one if the attack is not over in 15 minutes. 20 tablet 0  . MEDROXYPROGESTERONE ACETATE 150 MG/ML IM SUSP Intramuscular Inject 150 mg into the muscle every 3 (three) months.      . ALPRAZOLAM 0.25 MG PO TABS Oral Take 1 tablet (0.25 mg total) by mouth 3 (three) times daily as needed for sleep or anxiety. 20 tablet 0    BP 119/81  Pulse 89  Temp(Src) 98 F (36.7 C) (Oral)  Resp 19  Ht 5\' 5"  (1.651 m)  Wt 155 lb (70.308 kg)  BMI 25.79 kg/m2  SpO2 100%  Physical Exam  Nursing note and vitals reviewed. Constitutional: She is oriented to person, place, and time. She appears  well-developed and well-nourished.  HENT:  Head: Normocephalic and atraumatic.  Eyes: Conjunctivae are normal.  Neck: Normal range of motion.  Cardiovascular: Normal rate, regular rhythm, normal heart sounds and intact distal pulses.   Pulmonary/Chest: Effort normal and breath sounds normal. She has no wheezes.  Abdominal: Soft. Bowel sounds are normal. There is no tenderness.  Musculoskeletal: Normal range of motion.  Neurological:  She is alert and oriented to person, place, and time.  Skin: Skin is warm and dry.  Psychiatric: She has a normal mood and affect. Her behavior is normal. Judgment and thought content normal.    ED Course  Procedures (including critical care time)   Labs Reviewed  GLUCOSE, CAPILLARY  LAB REPORT - SCANNED   No results found.   1. Anxiety   2. Medication refill       MDM  Xanax 0.25 mg #20 refilled.  Patient to see psychiatry at [facility] in 3 days to establish care.  Also recommended establishing primary medical care - referral given.        Candis Musa, PA 02/28/11 0147  Candis Musa, PA 02/28/11 [phone]

## 2011-02-28 NOTE — ED Provider Notes (Signed)
Medical screening examination/treatment/procedure(s) were performed by non-physician practitioner and as supervising physician I was immediately available for consultation/collaboration. Shirley Decamp, MD, FACEP   Breah Joa L Algis Lehenbauer, MD 02/28/11 0724 

## 2011-04-06 DIAGNOSIS — F410 Panic disorder [episodic paroxysmal anxiety] without agoraphobia: Secondary | ICD-10-CM

## 2011-04-06 HISTORY — DX: Panic disorder (episodic paroxysmal anxiety): F41.0

## 2011-12-09 ENCOUNTER — Encounter (HOSPITAL_COMMUNITY): Payer: Self-pay | Admitting: *Deleted

## 2011-12-09 ENCOUNTER — Emergency Department (HOSPITAL_COMMUNITY)
Admission: EM | Admit: 2011-12-09 | Discharge: 2011-12-09 | Disposition: A | Discharge status: Home / Self Care | Payer: Self-pay | Attending: Emergency Medicine | Admitting: Emergency Medicine

## 2011-12-09 DIAGNOSIS — N898 Other specified noninflammatory disorders of vagina: Secondary | ICD-10-CM | POA: Insufficient documentation

## 2011-12-09 DIAGNOSIS — Z3202 Encounter for pregnancy test, result negative: Secondary | ICD-10-CM | POA: Insufficient documentation

## 2011-12-09 DIAGNOSIS — F410 Panic disorder [episodic paroxysmal anxiety] without agoraphobia: Secondary | ICD-10-CM | POA: Insufficient documentation

## 2011-12-09 DIAGNOSIS — N939 Abnormal uterine and vaginal bleeding, unspecified: Secondary | ICD-10-CM

## 2011-12-09 LAB — CBC WITH DIFFERENTIAL/PLATELET
Basophils Absolute: 0.1 10*3/uL (ref 0.0–0.1)
HCT: 38.8 % (ref 36.0–46.0)
Hemoglobin: 13.3 g/dL (ref 12.0–15.0)
Lymphocytes Relative: 22 % (ref 12–46)
Lymphs Abs: 1.8 10*3/uL (ref 0.7–4.0)
Monocytes Absolute: 0.5 10*3/uL (ref 0.1–1.0)
Monocytes Relative: 6 % (ref 3–12)
Neutro Abs: 5.6 10*3/uL (ref 1.7–7.7)
RBC: 4.73 MIL/uL (ref 3.87–5.11)
WBC: 8.2 10*3/uL (ref 4.0–10.5)

## 2011-12-09 LAB — BASIC METABOLIC PANEL
BUN: 14 mg/dL (ref 6–23)
CO2: 22 mEq/L (ref 19–32)
Chloride: 103 mEq/L (ref 96–112)
Creatinine, Ser: 0.62 mg/dL (ref 0.50–1.10)

## 2011-12-09 LAB — URINALYSIS, ROUTINE W REFLEX MICROSCOPIC
Glucose, UA: NEGATIVE mg/dL
Ketones, ur: NEGATIVE mg/dL
Leukocytes, UA: NEGATIVE
pH: 5 (ref 5.0–8.0)

## 2011-12-09 LAB — POCT PREGNANCY, URINE: Preg Test, Ur: NEGATIVE

## 2011-12-09 LAB — URINE MICROSCOPIC-ADD ON

## 2011-12-09 MED ORDER — HYDROCODONE-ACETAMINOPHEN 5-325 MG PO TABS: 1.0000 | ORAL_TABLET | Freq: Four times a day (QID) | ORAL | Status: AC | PRN

## 2011-12-09 NOTE — ED Notes (Addendum)
Pt c/o left lower quad abd pain, vaginal bleeding, pt states that she has taken two home pregnancy tests both positive, went to bathroom tonight, began to experience left lower abd pain, and had a gush of dark pink red blood, pt states that she is still bleeding but that it is "light" and having dizziness.  Last menstrual cycle was November 05, 2011.

## 2011-12-09 NOTE — ED Notes (Signed)
POC Urine Pregnancy test  NEGATIVE

## 2011-12-09 NOTE — ED Notes (Addendum)
Patient is driving self home - no orders received for pain medication.

## 2011-12-09 NOTE — Discharge Instructions (Signed)
Abnormal Vaginal Bleeding Abnormal vaginal bleeding means bleeding from the vagina that is not your normal menstrual period. Bleeding may be heavy or light. It may last for days or come and go. There are many problems that may cause this. HOME CARE  Keep track of your periods on a calendar if they are not regular.   Write down:   How often pads or tampons are changed.   The size and number of clots, if there are any.   A change in the color of the blood.   A change in the amount of blood.   Any smell.   The time and strength of cramps or pain.   Limit activity as told.   Eat a healthy diet.   Do not have sex (intercourse) until your doctor says it is okay.   Never have unprotected sex unless you are trying to get pregnant.   Only take medicine as told by your doctor.  GET HELP RIGHT AWAY IF:   You get dizzy or feel faint when standing up.   You have to change pads or tampons more than once an hour.   You feel a sudden change in your pain.   You start bleeding heavily.   You develop a fever.  MAKE SURE YOU:  Understand these instructions.   Will watch your condition.   Will get help right away if you are not doing well or get worse.  Document Released: 01/17/2009 Document Revised: 03/11/2011 Document Reviewed: 01/17/2009 [facility] Patient Information 2012 [place], [place].  It is also possible that you had a very early miscarriage and that the vaginal bleeding could be related to that. Followup with Dr. Emelda Fear in the next few days. Return for any newer worse symptoms.

## 2011-12-09 NOTE — ED Provider Notes (Signed)
History     CSN: [id number]  Arrival date & time 12/09/11  0026   First MD Initiated Contact with Patient 12/09/11 [phone]      Chief Complaint  Patient presents with  . Vaginal Bleeding  . Abdominal Pain    (Consider location/radiation/quality/duration/timing/severity/associated sxs/prior treatment) Patient is a 21 y.o. female presenting with vaginal bleeding and abdominal pain. The history is provided by the patient.  Vaginal Bleeding Associated symptoms include abdominal pain. Pertinent negatives include no chest pain, no headaches and no shortness of breath.  Abdominal Pain The primary symptoms of the illness include abdominal pain and vaginal bleeding. The primary symptoms of the illness do not include fever, shortness of breath, nausea, vomiting, dysuria or vaginal discharge.  Symptoms associated with the illness do not include back pain.  patient is a 21 year old female presents for vaginal bleeding and lower corner abdominal pain. Her OB/GYN doctor is Dr. Emelda Fear her last menstrual period was August 2 she's had a home pregnancy test that was positive in the last few days. Pregnancy today would make her gravida 6 para 4 no nausea no vomiting and abdominal pain greater on the left side. Described as sharp ache nonradiating pain would be 4-5/10.  Past Medical History  Diagnosis Date  . Panic attacks   . Vertigo     Past Surgical History  Procedure Date  . Urethra stretched   . Cesarean section     History reviewed. No pertinent family history.  History  Substance Use Topics  . Smoking status: Never Smoker   . Smokeless tobacco: Not on file  . Alcohol Use: No    OB History    Grav Para Term Preterm Abortions TAB SAB Ect Mult Living                  Review of Systems  Constitutional: Negative for fever.  HENT: Negative for neck pain.   Eyes: Negative for redness.  Respiratory: Negative for shortness of breath.   Cardiovascular: Negative for chest pain.    Gastrointestinal: Positive for abdominal pain. Negative for nausea and vomiting.  Genitourinary: Positive for vaginal bleeding and pelvic pain. Negative for dysuria and vaginal discharge.  Musculoskeletal: Negative for back pain.  Skin: Negative for rash.  Neurological: Negative for headaches.  Hematological: Does not bruise/bleed easily.    Allergies  Review of patient's allergies indicates no known allergies.  Home Medications   Current Outpatient Rx  Name Route Sig Dispense Refill  . ALPRAZOLAM 0.25 MG PO TABS  Take one at the first signs of anxiety attack, repeat a second one if the attack is not over in 15 minutes. 20 tablet 0  . HYDROCODONE-ACETAMINOPHEN 5-325 MG PO TABS Oral Take 1-2 tablets by mouth every 6 (six) hours as needed for pain. 10 tablet 0  . MEDROXYPROGESTERONE ACETATE 150 MG/ML IM SUSP Intramuscular Inject 150 mg into the muscle every 3 (three) months.        BP 115/80  Pulse 87  Temp 98.3 F (36.8 C)  Resp 20  Ht 5\' 5"  (1.651 m)  Wt 155 lb (70.308 kg)  BMI 25.79 kg/m2  SpO2 97%  LMP 11/05/2011  Physical Exam  Nursing note and vitals reviewed. Constitutional: She is oriented to person, place, and time. She appears well-developed and well-nourished. No distress.  HENT:  Head: Normocephalic and atraumatic.  Mouth/Throat: Oropharynx is clear and moist.  Eyes: Conjunctivae and EOM are normal. Pupils are equal, round, and reactive to light.  Neck: Normal  range of motion. Neck supple.  Cardiovascular: Normal rate, regular rhythm and normal heart sounds.   No murmur heard. Pulmonary/Chest: Effort normal and breath sounds normal.  Abdominal: Soft. Bowel sounds are normal. There is tenderness.       Mild tenderness of Left lower corner and suprapubic area.  Musculoskeletal: Normal range of motion. She exhibits no edema and no tenderness.  Neurological: She is alert and oriented to person, place, and time. No cranial nerve deficit. She exhibits normal muscle  tone.  Skin: Skin is warm. No rash noted.    ED Course  Procedures (including critical care time)  Labs Reviewed  BASIC METABOLIC PANEL - Abnormal; Notable for the following:    Glucose, Bld 106 (*)     All other components within normal limits  URINALYSIS, ROUTINE W REFLEX MICROSCOPIC - Abnormal; Notable for the following:    Hgb urine dipstick LARGE (*)     Protein, ur TRACE (*)     All other components within normal limits  POCT PREGNANCY, URINE  CBC WITH DIFFERENTIAL  HCG, QUANTITATIVE, PREGNANCY  ABO/RH  URINE MICROSCOPIC-ADD ON   No results found. Results for orders placed during the hospital encounter of 12/09/11  POCT PREGNANCY, URINE      Component Value Range   Preg Test, Ur NEGATIVE  NEGATIVE  CBC WITH DIFFERENTIAL      Component Value Range   WBC 8.2  4.0 - 10.5 K/uL   RBC 4.73  3.87 - 5.11 MIL/uL   Hemoglobin 13.3  12.0 - 15.0 g/dL   HCT 16.1  09.6 - 04.5 %   MCV 82.0  78.0 - 100.0 fL   MCH 28.1  26.0 - 34.0 pg   MCHC 34.3  30.0 - 36.0 g/dL   RDW 40.9  81.1 - 91.4 %   Platelets 240  150 - 400 K/uL   Neutrophils Relative 68  43 - 77 %   Neutro Abs 5.6  1.7 - 7.7 K/uL   Lymphocytes Relative 22  12 - 46 %   Lymphs Abs 1.8  0.7 - 4.0 K/uL   Monocytes Relative 6  3 - 12 %   Monocytes Absolute 0.5  0.1 - 1.0 K/uL   Eosinophils Relative 3  0 - 5 %   Eosinophils Absolute 0.3  0.0 - 0.7 K/uL   Basophils Relative 1  0 - 1 %   Basophils Absolute 0.1  0.0 - 0.1 K/uL  BASIC METABOLIC PANEL      Component Value Range   Sodium 139  135 - 145 mEq/L   Potassium 3.9  3.5 - 5.1 mEq/L   Chloride 103  96 - 112 mEq/L   CO2 22  19 - 32 mEq/L   Glucose, Bld 106 (*) 70 - 99 mg/dL   BUN 14  6 - 23 mg/dL   Creatinine, Ser 7.82  0.50 - 1.10 mg/dL   Calcium 9.7  8.4 - 95.6 mg/dL   GFR calc non Af Amer >90  >90 mL/min   GFR calc Af Amer >90  >90 mL/min  URINALYSIS, ROUTINE W REFLEX MICROSCOPIC      Component Value Range   Color, Urine YELLOW  YELLOW   APPearance CLEAR   CLEAR   Specific Gravity, Urine 1.030  1.005 - 1.030   pH 5.0  5.0 - 8.0   Glucose, UA NEGATIVE  NEGATIVE mg/dL   Hgb urine dipstick LARGE (*) NEGATIVE   Bilirubin Urine NEGATIVE  NEGATIVE   Ketones, ur  NEGATIVE  NEGATIVE mg/dL   Protein, ur TRACE (*) NEGATIVE mg/dL   Urobilinogen, UA 0.2  0.0 - 1.0 mg/dL   Nitrite NEGATIVE  NEGATIVE   Leukocytes, UA NEGATIVE  NEGATIVE  HCG, QUANTITATIVE, PREGNANCY      Component Value Range   hCG, Beta Chain, Quant, S 3  <5 mIU/mL  ABO/RH      Component Value Range   ABO/RH(D) O POS    URINE MICROSCOPIC-ADD ON      Component Value Range   RBC / HPF 7-10  <3 RBC/hpf     1. Vaginal bleeding       MDM  Term pregnancy test was negative here quantitative hCG was very low less than 5 all suggestive of no pregnancy however patient did have some home pregnancy tests that were positive as possible that there was a very early miscarriage and that explains the vaginal bleeding also could be dysfunctional vaginal bleeding. Patient will followup with her OB/GYN Dr. In the next few days. She'll return for any new or worse symptoms.        Shelda Jakes, MD 12/09/11 [phone]

## 2011-12-09 NOTE — ED Notes (Signed)
Instructions and prescriptions reviewed and f/u information provided; verbalizes understanding.

## 2012-07-30 IMAGING — US US OB COMP LESS 14 WK
1 series · 14 of 28 positions shown · non-contrast
Comparison: 12/26/2008

CLINICAL DATA: Seven weeks pregnant, abdominal and pelvic pain

OBSTETRIC <14 WK US AND TRANSVAGINAL OB US
TECHNIQUE: Both transabdominal and transvaginal ultrasound
examinations were performed for complete evaluation of the
gestation as well as the maternal uterus, adnexal regions, and
pelvic cul-de-sac.  Transvaginal technique was performed to assess
early pregnancy.

[Series 1: us ob comp less 14 wk · 0.21mm/px · 14 of 87 slices shown]
[im 4/87]
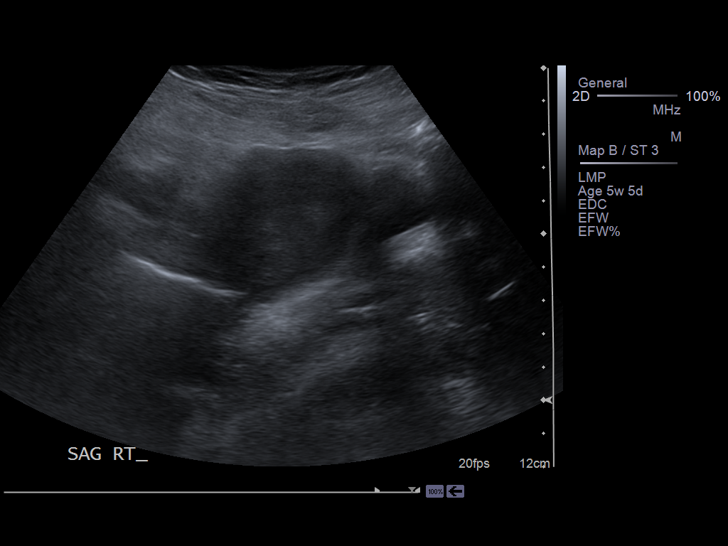
[im 10/87]
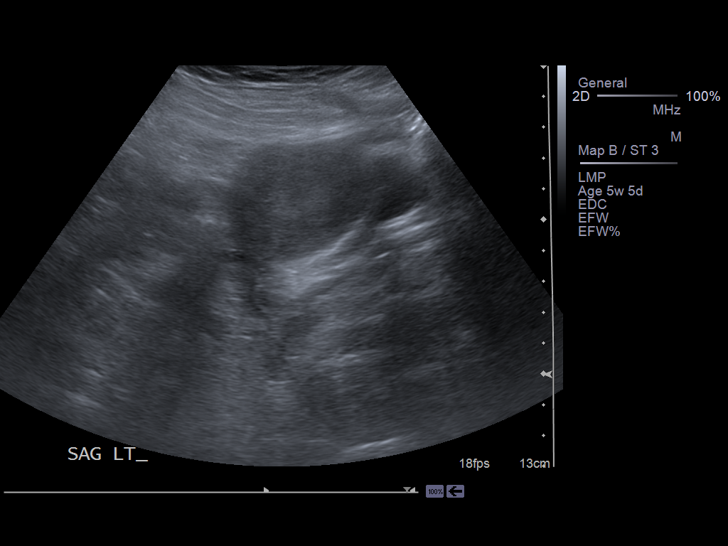
[im 16/87]
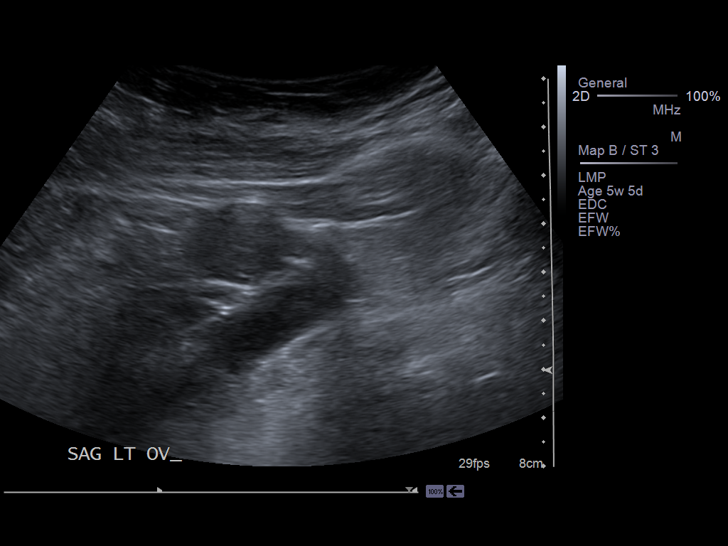
[im 23/87]
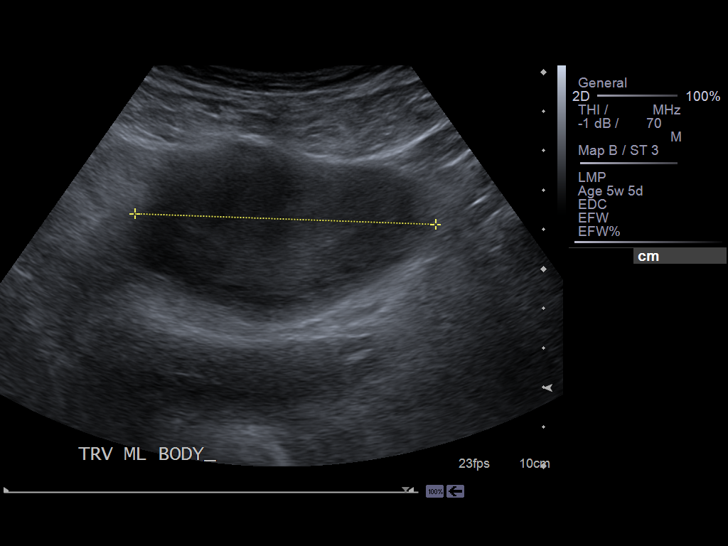
[im 29/87]
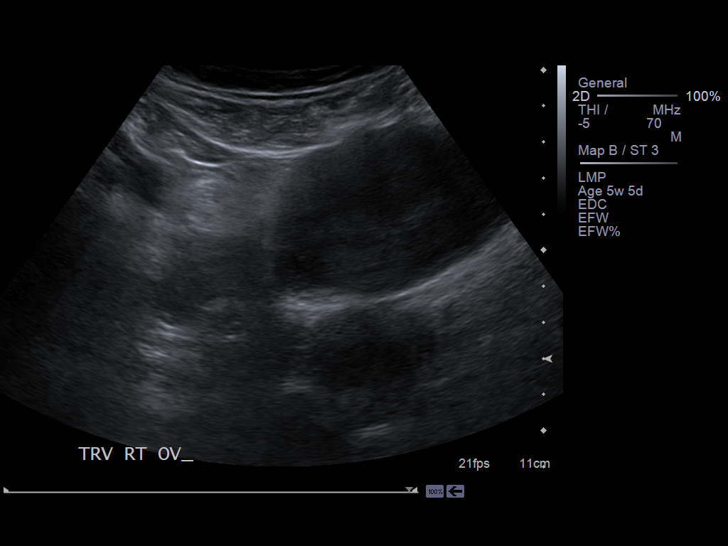
[im 36/87]
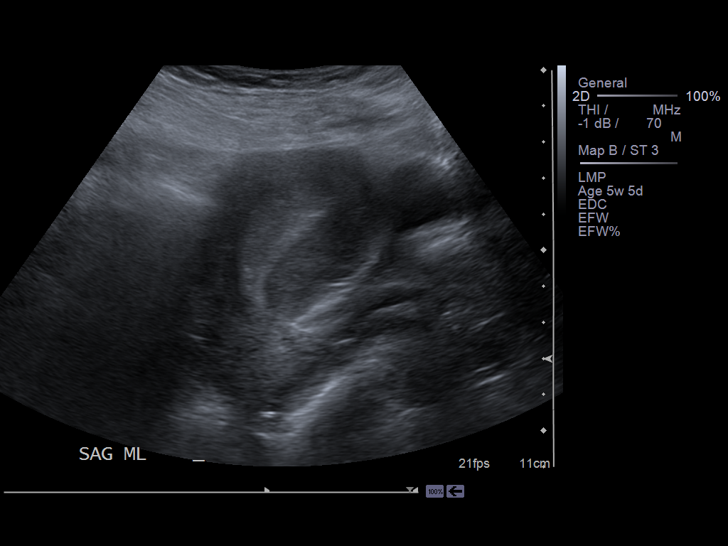
[im 42/87]
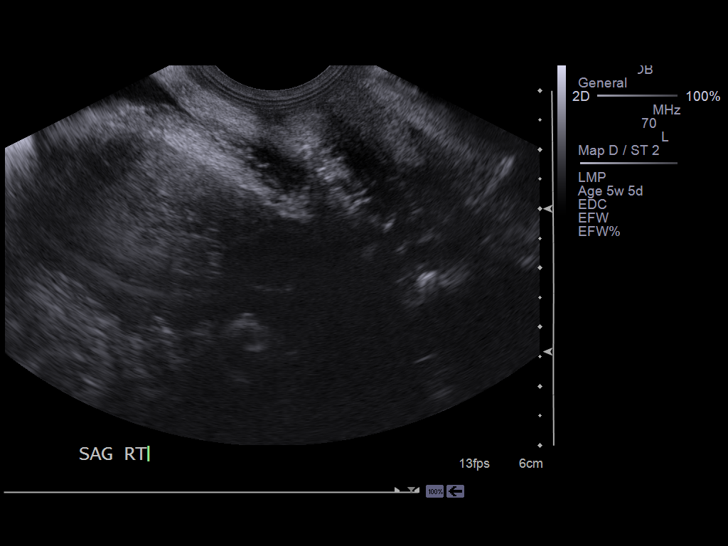
[im 48/87]
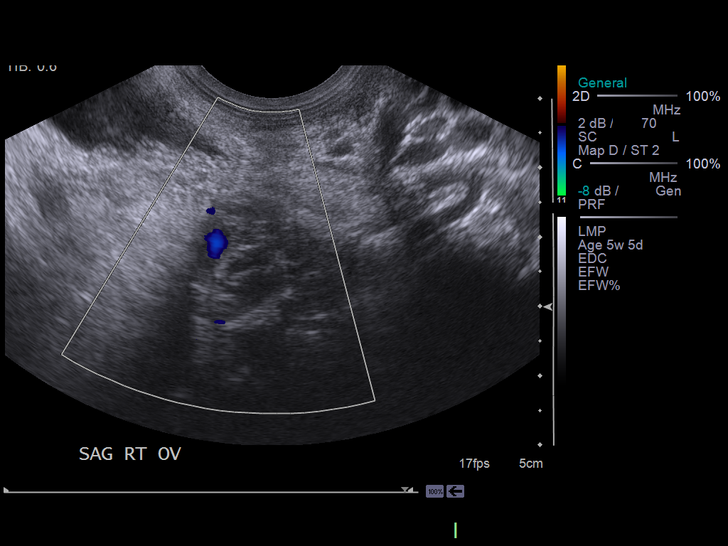
[im 55/87]
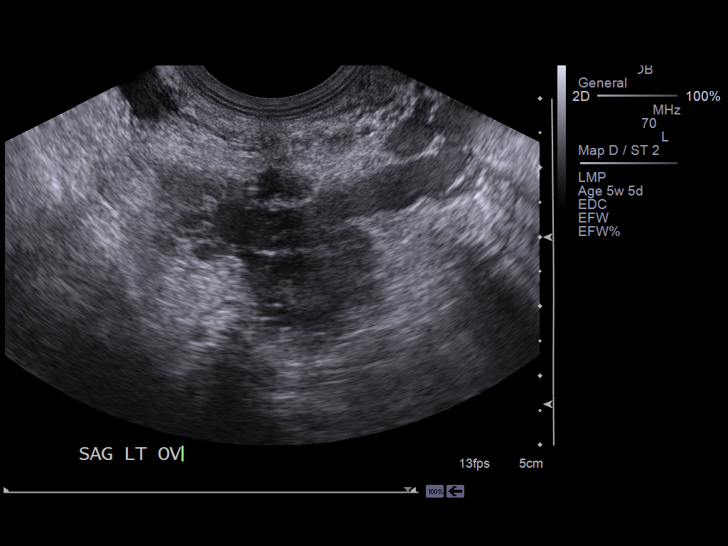
[im 61/87]
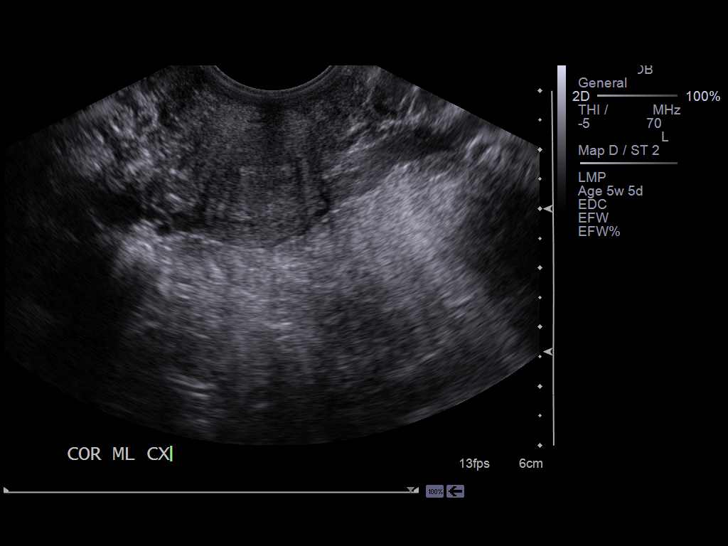
[im 67/87]
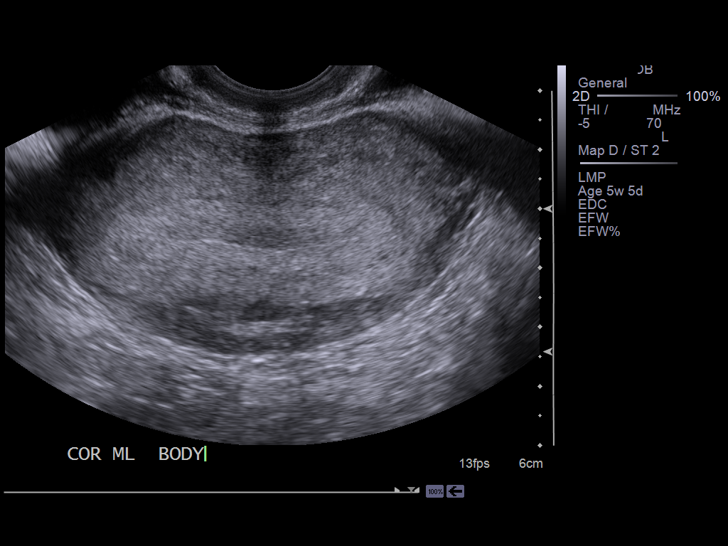
[im 74/87]
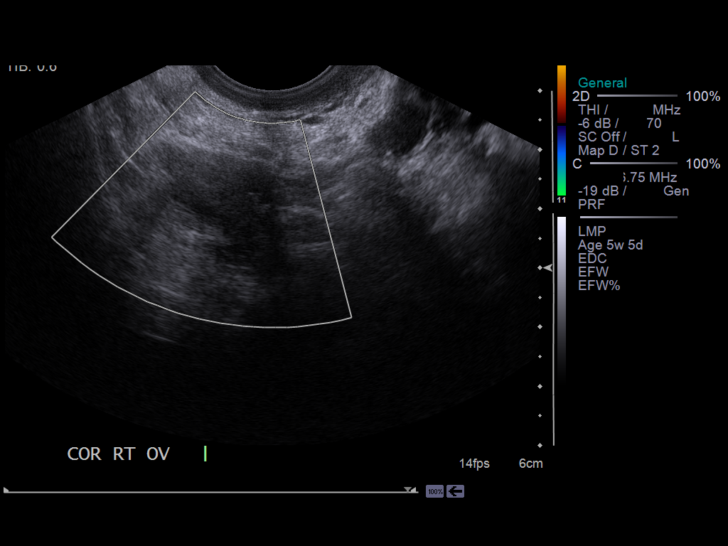
[im 80/87]
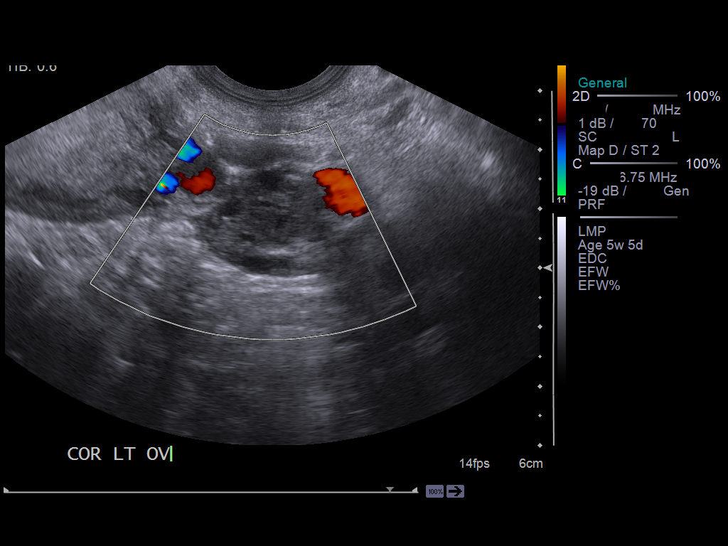
[im 87/87]
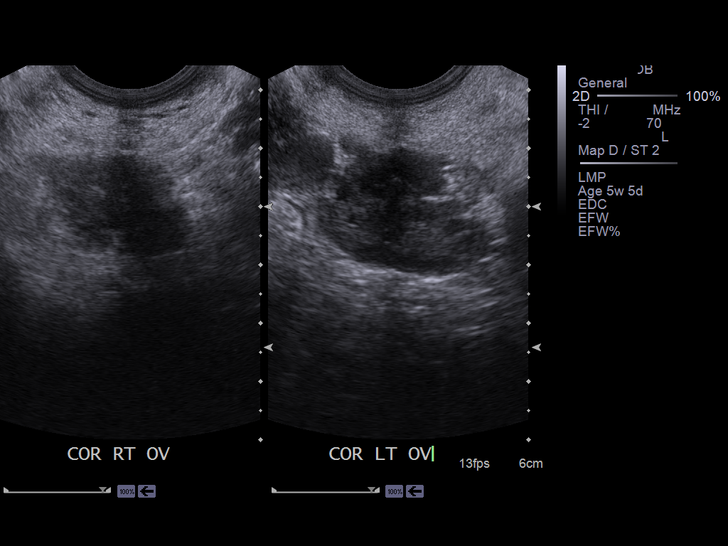

[14 of 28 positions shown; findings below may reference images not displayed]

Intrauterine gestational sac:  Not visualized

Maternal uterus/adnexae:
Uterus is normal in size, shape and position.  No contour
abnormality or fibroid.  Endometrial is thickened measuring 1.7 cm.
No intrauterine gestational sac noted.  Ovaries appear normal.
Right ovary measures 2.4 x 1.8 x 2.1 cm.  Left ovary measures 2.4 x
1.8 x 1.8 cm.  Small amount of free fluid in the cul-de-sac.  No
other adnexal abnormality appreciated.
IMPRESSION: No visualized intrauterine pregnancy or gestational sac.
 Thickened endometrium noted. Findings may be consistent with a
pregnancy too early to visualize.

Normal appearing ovaries.  No adnexal abnormality.

Small of pelvic free fluid.

## 2013-04-05 NOTE — L&D Delivery Note (Signed)
Delivery Note At 6:16 PM a viable and healthy female was delivered via Vaginal, Spontaneous Delivery (Presentation: Left Occiput Anterior).  APGAR: 8, 9; weight 7 lb 5.3 oz (3325 g).   Placenta status: Intact, Spontaneous.  Cord: 3 vessels with the following complications: Short.   Anesthesia: Local Epidural  Episiotomy: None Lacerations: 2nd degree;Labial Suture Repair: 3.0 vicryl Est. Blood Loss (mL): 500  Mom to postpartum.  Baby to Couplet care / Skin to Skin.  [name], Aleksa Collinsworth L, MD 03/23/2014, 7:47 PM

## 2013-08-02 ENCOUNTER — Other Ambulatory Visit: Payer: Self-pay | Admitting: Obstetrics & Gynecology

## 2013-08-02 DIAGNOSIS — O3680X0 Pregnancy with inconclusive fetal viability, not applicable or unspecified: Secondary | ICD-10-CM

## 2013-08-06 ENCOUNTER — Ambulatory Visit (INDEPENDENT_AMBULATORY_CARE_PROVIDER_SITE_OTHER): Payer: Medicaid Other

## 2013-08-06 ENCOUNTER — Other Ambulatory Visit: Payer: Self-pay | Admitting: Obstetrics & Gynecology

## 2013-08-06 DIAGNOSIS — O34219 Maternal care for unspecified type scar from previous cesarean delivery: Secondary | ICD-10-CM

## 2013-08-06 DIAGNOSIS — O3680X0 Pregnancy with inconclusive fetal viability, not applicable or unspecified: Secondary | ICD-10-CM

## 2013-08-06 DIAGNOSIS — O26849 Uterine size-date discrepancy, unspecified trimester: Secondary | ICD-10-CM

## 2013-08-06 NOTE — Progress Notes (Signed)
U/S-single IUP with +FCA noted, FHR-170 bpm, cx appears long and closed, bilateral adnexa appears wnl, CRL c/w 8+0 wks EDD 03/18/2014, + YS noted

## 2013-08-21 ENCOUNTER — Encounter: Payer: Self-pay | Admitting: Advanced Practice Midwife

## 2013-09-11 ENCOUNTER — Encounter: Payer: Self-pay | Admitting: Adult Health

## 2013-09-11 ENCOUNTER — Ambulatory Visit (INDEPENDENT_AMBULATORY_CARE_PROVIDER_SITE_OTHER): Payer: Self-pay | Admitting: Adult Health

## 2013-09-11 ENCOUNTER — Other Ambulatory Visit (HOSPITAL_COMMUNITY)
Admission: RE | Admit: 2013-09-11 | Discharge: 2013-09-11 | Disposition: A | Discharge status: Home / Self Care | Payer: Medicaid Other | Source: Ambulatory Visit | Attending: Adult Health | Admitting: Adult Health

## 2013-09-11 VITALS — BP 110/60 | Wt 151.0 lb

## 2013-09-11 DIAGNOSIS — O34219 Maternal care for unspecified type scar from previous cesarean delivery: Secondary | ICD-10-CM

## 2013-09-11 DIAGNOSIS — Z1389 Encounter for screening for other disorder: Secondary | ICD-10-CM

## 2013-09-11 DIAGNOSIS — Z01419 Encounter for gynecological examination (general) (routine) without abnormal findings: Secondary | ICD-10-CM | POA: Insufficient documentation

## 2013-09-11 DIAGNOSIS — Z3480 Encounter for supervision of other normal pregnancy, unspecified trimester: Secondary | ICD-10-CM

## 2013-09-11 DIAGNOSIS — Z331 Pregnant state, incidental: Secondary | ICD-10-CM

## 2013-09-11 DIAGNOSIS — Z3490 Encounter for supervision of normal pregnancy, unspecified, unspecified trimester: Secondary | ICD-10-CM

## 2013-09-11 DIAGNOSIS — Z113 Encounter for screening for infections with a predominantly sexual mode of transmission: Secondary | ICD-10-CM | POA: Insufficient documentation

## 2013-09-11 HISTORY — DX: Encounter for supervision of normal pregnancy, unspecified, unspecified trimester: Z34.90

## 2013-09-11 LAB — POCT URINALYSIS DIPSTICK
GLUCOSE UA: NEGATIVE
KETONES UA: NEGATIVE
NITRITE UA: NEGATIVE
Protein, UA: NEGATIVE

## 2013-09-11 LAB — CBC
HCT: 36.1 % (ref 36.0–46.0)
Hemoglobin: 12.7 g/dL (ref 12.0–15.0)
MCH: 28.2 pg (ref 26.0–34.0)
MCHC: 35.2 g/dL (ref 30.0–36.0)
MCV: 80 fL (ref 78.0–100.0)
Platelets: 186 10*3/uL (ref 150–400)
RBC: 4.51 MIL/uL (ref 3.87–5.11)
RDW: 17.2 % — AB (ref 11.5–15.5)
WBC: 7.2 10*3/uL (ref 4.0–10.5)

## 2013-09-11 NOTE — Patient Instructions (Signed)
Second Trimester of Pregnancy The second trimester is from week 13 through week 28, months 4 through 6. The second trimester is often a time when you feel your best. Your body has also adjusted to being pregnant, and you begin to feel better physically. Usually, morning sickness has lessened or quit completely, you may have more energy, and you may have an increase in appetite. The second trimester is also a time when the fetus is growing rapidly. At the end of the sixth month, the fetus is about 9 inches long and weighs about 1 pounds. You will likely begin to feel the baby move (quickening) between 18 and 20 weeks of the pregnancy. BODY CHANGES Your body goes through many changes during pregnancy. The changes vary from woman to woman.   Your weight will continue to increase. You will notice your lower abdomen bulging out.  You may begin to get stretch marks on your hips, abdomen, and breasts.  You may develop headaches that can be relieved by medicines approved by your caregiver.  You may urinate more often because the fetus is pressing on your bladder.  You may develop or continue to have heartburn as a result of your pregnancy.  You may develop constipation because certain hormones are causing the muscles that push waste through your intestines to slow down.  You may develop hemorrhoids or swollen, bulging veins (varicose veins).  You may have back pain because of the weight gain and pregnancy hormones relaxing your joints between the bones in your pelvis and as a result of a shift in weight and the muscles that support your balance.  Your breasts will continue to grow and be tender.  Your gums may bleed and may be sensitive to brushing and flossing.  Dark spots or blotches (chloasma, mask of pregnancy) may develop on your face. This will likely fade after the baby is born.  A dark line from your belly button to the pubic area (linea nigra) may appear. This will likely fade after the  baby is born. WHAT TO EXPECT AT YOUR PRENATAL VISITS During a routine prenatal visit:  You will be weighed to make sure you and the fetus are growing normally.  Your blood pressure will be taken.  Your abdomen will be measured to track your baby's growth.  The fetal heartbeat will be listened to.  Any test results from the previous visit will be discussed. Your caregiver may ask you:  How you are feeling.  If you are feeling the baby move.  If you have had any abnormal symptoms, such as leaking fluid, bleeding, severe headaches, or abdominal cramping.  If you have any questions. Other tests that may be performed during your second trimester include:  Blood tests that check for:  Low iron levels (anemia).  Gestational diabetes (between 24 and 28 weeks).  Rh antibodies.  Urine tests to check for infections, diabetes, or protein in the urine.  An ultrasound to confirm the proper growth and development of the baby.  An amniocentesis to check for possible genetic problems.  Fetal screens for spina bifida and Down syndrome. HOME CARE INSTRUCTIONS   Avoid all smoking, herbs, alcohol, and unprescribed drugs. These chemicals affect the formation and growth of the baby.  Follow your caregiver's instructions regarding medicine use. There are medicines that are either safe or unsafe to take during pregnancy.  Exercise only as directed by your caregiver. Experiencing uterine cramps is a good sign to stop exercising.  Continue to eat regular,   healthy meals.  Wear a good support bra for breast tenderness.  Do not use hot tubs, steam rooms, or saunas.  Wear your seat belt at all times when driving.  Avoid raw meat, uncooked cheese, cat litter boxes, and soil used by cats. These carry germs that can cause birth defects in the baby.  Take your prenatal vitamins.  Try taking a stool softener (if your caregiver approves) if you develop constipation. Eat more high-fiber foods,  such as fresh vegetables or fruit and whole grains. Drink plenty of fluids to keep your urine clear or pale yellow.  Take warm sitz baths to soothe any pain or discomfort caused by hemorrhoids. Use hemorrhoid cream if your caregiver approves.  If you develop varicose veins, wear support hose. Elevate your feet for 15 minutes, 3 4 times a day. Limit salt in your diet.  Avoid heavy lifting, wear low heel shoes, and practice good posture.  Rest with your legs elevated if you have leg cramps or low back pain.  Visit your dentist if you have not gone yet during your pregnancy. Use a soft toothbrush to brush your teeth and be gentle when you floss.  A sexual relationship may be continued unless your caregiver directs you otherwise.  Continue to go to all your prenatal visits as directed by your caregiver. SEEK MEDICAL CARE IF:   You have dizziness.  You have mild pelvic cramps, pelvic pressure, or nagging pain in the abdominal area.  You have persistent nausea, vomiting, or diarrhea.  You have a bad smelling vaginal discharge.  You have pain with urination. SEEK IMMEDIATE MEDICAL CARE IF:   You have a fever.  You are leaking fluid from your vagina.  You have spotting or bleeding from your vagina.  You have severe abdominal cramping or pain.  You have rapid weight gain or loss.  You have shortness of breath with chest pain.  You notice sudden or extreme swelling of your face, hands, ankles, feet, or legs.  You have not felt your baby move in over an hour.  You have severe headaches that do not go away with medicine.  You have vision changes. Document Released: 03/16/2001 Document Revised: 11/22/2012 Document Reviewed: 05/23/2012 [facility] Patient Information 2014 [place], [place]. Return in 3 weeks for AFP and 2 hour GTT

## 2013-09-11 NOTE — Progress Notes (Signed)
Pt here today for New OB visit. Pt given CCNC form and lab consents to read over and sign. Pt states that she was having sudden headaches and dizziness for a few spells.

## 2013-09-11 NOTE — Progress Notes (Signed)
Subjective:  [name] is a 23 y.o. [phone] Caucasian female at [redacted]w[redacted]d by [place] being seen today for her first obstetrical visit.  Her obstetrical history is significant for history of gestational diabetes and has had 1 Csection, desires VBAC.  Pregnancy history fully reviewed.  Patient reports headache and dizziness, try to eat every 2 hours with some protein, will get early 2 hr GTT.  Denies vb, cramping, uti s/s, abnormal/malodorous vag d/c, or vulvovaginal itching/irritation.  BP 110/60  Wt 151 lb (68.493 kg)  LMP 06/02/2013  HISTORY: OB History  Gravida Para Term Preterm AB SAB TAB Ectopic Multiple Living  6 4 3 1 1 1    4     # Outcome Date GA Lbr Len/2nd Weight Sex Delivery Anes PTL Lv  6 CUR           5 SAB 2013 [redacted]w[redacted]d         4 TRM 08/18/10 [redacted]w[redacted]d  7 lb (3.175 kg) F SVD   Y  3 TRM 08/12/09 [redacted]w[redacted]d  6 lb 11 oz (3.033 kg) F LTCS   Y  2 PRE 03/26/08 [redacted]w[redacted]d  5 lb 12 oz (2.608 kg) F SVD   Y  1 TRM 02/07/04 [redacted]w[redacted]d  5 lb 5 oz (2.41 kg) F SVD   Y     Past Medical History  Diagnosis Date  . Panic attacks   . Vertigo   . Supervision of normal pregnancy 09/11/2013   Past Surgical History  Procedure Laterality Date  . Urethra stretched    . Cesarean section     Family History  Problem Relation Age of Onset  . Hypertension Mother   . Lupus Mother   . Mental illness Mother   . Bipolar disorder Mother   . ADD / ADHD Mother   . Bipolar disorder Brother   . ADD / ADHD Brother   . Thyroid disease Maternal Aunt   . Heart disease Maternal Aunt   . Lupus Maternal Aunt   . Thyroid disease Maternal Uncle   . Heart attack Maternal Uncle   . Heart disease Maternal Uncle   . Lupus Maternal Uncle   . Heart attack Maternal Grandmother   . Heart disease Maternal Grandmother   . Hypertension Maternal Grandmother   . Cancer Maternal Grandfather   . Hypertension Paternal Grandmother     Exam   System:     General: Well developed & nourished, no acute distress   Skin: Warm & dry, normal  coloration and turgor, no rashes   Neurologic: Alert & oriented, normal mood   Cardiovascular: Regular rate & rhythm   Respiratory: Effort & rate normal, LCTAB, acyanotic   Abdomen: Soft, non tender   Extremities: normal strength, tone                                                 Thyroid normal Pelvic Exam:    Perineum: Normal perineum   Vulva: Normal, no lesions   Vagina:  Normal mucosa, normal discharge   Cervix: Normal, bulbous, appears closed   Uterus: Normal size/shape/contour for GA   Thin prep pap smear  With GC/CHL FHR: 152  via doppler   Assessment:   Pregnancy: [id number] Patient Active Problem List   Diagnosis Date Noted  . Supervision of normal pregnancy 09/11/2013    [redacted]w[redacted]d [id number] New OB visit  Plan:  Initial labs drawn Continue prenatal vitamins Problem list reviewed and updated Reviewed n/v relief measures and warning s/s to report Reviewed recommended weight gain based on pre-gravid BMI Encouraged well-balanced diet Genetic Screening discussed Quad Screen: requested Cystic fibrosis screening discussed declined Ultrasound discussed; fetal survey: requested Follow up in 3 weeks for AFP and 2 hr GTT  Adline PotterJennifer A. Merril Nagy, NP 09/11/2013 10:59 AM

## 2013-09-12 LAB — URINALYSIS
Bilirubin Urine: NEGATIVE
Glucose, UA: NEGATIVE mg/dL
HGB URINE DIPSTICK: NEGATIVE
Ketones, ur: NEGATIVE mg/dL
NITRITE: NEGATIVE
PROTEIN: NEGATIVE mg/dL
Specific Gravity, Urine: 1.027 (ref 1.005–1.030)
UROBILINOGEN UA: 1 mg/dL (ref 0.0–1.0)
pH: 5.5 (ref 5.0–8.0)

## 2013-09-12 LAB — HIV ANTIBODY (ROUTINE TESTING W REFLEX): HIV: NONREACTIVE

## 2013-09-12 LAB — RUBELLA SCREEN: RUBELLA: 1.28 {index} — AB (ref <0.90)

## 2013-09-12 LAB — DRUG SCREEN, URINE, NO CONFIRMATION
AMPHETAMINE SCRN UR: NEGATIVE
BARBITURATE QUANT UR: NEGATIVE
Benzodiazepines.: NEGATIVE
COCAINE METABOLITES: NEGATIVE
Creatinine,U: 185.5 mg/dL
MARIJUANA METABOLITE: NEGATIVE
METHADONE: NEGATIVE
OPIATE SCREEN, URINE: NEGATIVE
Phencyclidine (PCP): NEGATIVE
Propoxyphene: NEGATIVE

## 2013-09-12 LAB — VARICELLA ZOSTER ANTIBODY, IGG: VARICELLA IGG: 2158 {index} — AB (ref <135.00)

## 2013-09-12 LAB — RPR

## 2013-09-12 LAB — TSH: TSH: 2.433 u[IU]/mL (ref 0.350–4.500)

## 2013-09-12 LAB — HEPATITIS B SURFACE ANTIGEN: Hepatitis B Surface Ag: NEGATIVE

## 2013-09-12 LAB — URINE CULTURE
COLONY COUNT: NO GROWTH
Organism ID, Bacteria: NO GROWTH

## 2013-09-12 LAB — OXYCODONE SCREEN, UA, RFLX CONFIRM: OXYCODONE SCRN UR: NEGATIVE ng/mL

## 2013-09-13 LAB — ABO AND RH: RH TYPE: POSITIVE

## 2013-09-13 LAB — ANTIBODY SCREEN: ANTIBODY SCREEN: NEGATIVE

## 2013-09-14 LAB — CYTOLOGY - PAP

## 2013-10-02 ENCOUNTER — Other Ambulatory Visit: Payer: Medicaid Other

## 2013-10-02 ENCOUNTER — Encounter: Payer: Medicaid Other | Admitting: Advanced Practice Midwife

## 2014-02-04 ENCOUNTER — Encounter: Payer: Self-pay | Admitting: Adult Health

## 2014-03-21 ENCOUNTER — Inpatient Hospital Stay (HOSPITAL_COMMUNITY): Payer: Medicaid Other

## 2014-03-21 ENCOUNTER — Inpatient Hospital Stay (HOSPITAL_COMMUNITY)
Admission: AD | Admit: 2014-03-21 | Discharge: 2014-03-22 | Disposition: A | Discharge status: Home / Self Care | Payer: Medicaid Other | Source: Ambulatory Visit | Attending: Obstetrics and Gynecology | Admitting: Obstetrics and Gynecology

## 2014-03-21 ENCOUNTER — Encounter (HOSPITAL_COMMUNITY): Payer: Self-pay | Admitting: *Deleted

## 2014-03-21 DIAGNOSIS — O0933 Supervision of pregnancy with insufficient antenatal care, third trimester: Secondary | ICD-10-CM | POA: Insufficient documentation

## 2014-03-21 DIAGNOSIS — O34219 Maternal care for unspecified type scar from previous cesarean delivery: Secondary | ICD-10-CM | POA: Insufficient documentation

## 2014-03-21 DIAGNOSIS — O471 False labor at or after 37 completed weeks of gestation: Secondary | ICD-10-CM | POA: Insufficient documentation

## 2014-03-21 DIAGNOSIS — Z3A40 40 weeks gestation of pregnancy: Secondary | ICD-10-CM | POA: Insufficient documentation

## 2014-03-21 DIAGNOSIS — O479 False labor, unspecified: Secondary | ICD-10-CM

## 2014-03-21 DIAGNOSIS — IMO0002 Reserved for concepts with insufficient information to code with codable children: Secondary | ICD-10-CM

## 2014-03-21 DIAGNOSIS — O3421 Maternal care for scar from previous cesarean delivery: Secondary | ICD-10-CM | POA: Insufficient documentation

## 2014-03-21 DIAGNOSIS — Z0489 Encounter for examination and observation for other specified reasons: Secondary | ICD-10-CM | POA: Insufficient documentation

## 2014-03-21 LAB — GROUP B STREP BY PCR: Group B strep by PCR: NEGATIVE

## 2014-03-21 LAB — RAPID URINE DRUG SCREEN, HOSP PERFORMED
AMPHETAMINES: NOT DETECTED
Barbiturates: NOT DETECTED
Benzodiazepines: NOT DETECTED
COCAINE: NOT DETECTED
OPIATES: NOT DETECTED
Tetrahydrocannabinol: NOT DETECTED

## 2014-03-21 LAB — URINALYSIS, ROUTINE W REFLEX MICROSCOPIC
Bilirubin Urine: NEGATIVE
GLUCOSE, UA: NEGATIVE mg/dL
Hgb urine dipstick: NEGATIVE
Ketones, ur: 15 mg/dL — AB
Nitrite: NEGATIVE
PROTEIN: NEGATIVE mg/dL
SPECIFIC GRAVITY, URINE: 1.02 (ref 1.005–1.030)
UROBILINOGEN UA: 2 mg/dL — AB (ref 0.0–1.0)
pH: 7.5 (ref 5.0–8.0)

## 2014-03-21 LAB — URINE MICROSCOPIC-ADD ON

## 2014-03-21 LAB — OB RESULTS CONSOLE GBS: STREP GROUP B AG: NEGATIVE

## 2014-03-21 NOTE — MAU Note (Signed)
PT  SAYS  WAS GOING  [facility] -  STOPPED AT 16 WEEKS-  DUE TO INSURANCE PROBLEMS.   NO FURTHER PNC.   DENIES HSV , MRSA, SROM,  BLEEDING.   NO GBS.    SAYS HER ANKLES ARE SWOLLEN  AND SHE FEELS PRESSURE.

## 2014-03-21 NOTE — MAU Provider Note (Signed)
First Provider Initiated Contact with Patient 03/21/14 2238      Chief Complaint:  Abdominal Pain   [name] is  23 y.o. [id number] at 838w3d presents complaining of Abdominal Pain She was having some pressure, but it stopped about an hour ago. She stated "I know I;m past my due date, so I wanted to know if the baby pooped in its sac."  SHe had one visit at [facility] at 14 weeks, then she lost her medicaid and has not had any PNC since then.    Obstetrical/Gynecological History: Hx CS (malpresentation) with a successful VBAC. Plans TOLAC.  OB History    Gravida Para Term Preterm AB TAB SAB Ectopic Multiple Living   6 4 3 1 1  1   4      Past Medical History: Past Medical History  Diagnosis Date  . Panic attacks   . Vertigo   . Supervision of normal pregnancy 09/11/2013    Past Surgical History: Past Surgical History  Procedure Laterality Date  . Urethra stretched    . Cesarean section      Family History: Family History  Problem Relation Age of Onset  . Hypertension Mother   . Lupus Mother   . Mental illness Mother   . Bipolar disorder Mother   . ADD / ADHD Mother   . Bipolar disorder Brother   . ADD / ADHD Brother   . Thyroid disease Maternal Aunt   . Heart disease Maternal Aunt   . Lupus Maternal Aunt   . Thyroid disease Maternal Uncle   . Heart attack Maternal Uncle   . Heart disease Maternal Uncle   . Lupus Maternal Uncle   . Heart attack Maternal Grandmother   . Heart disease Maternal Grandmother   . Hypertension Maternal Grandmother   . Cancer Maternal Grandfather   . Hypertension Paternal Grandmother     Social History: History  Substance Use Topics  . Smoking status: Never Smoker   . Smokeless tobacco: Never Used  . Alcohol Use: No    Allergies: No Known Allergies  Meds:  Prescriptions prior to admission  Medication Sig Dispense Refill Last Dose  . acetaminophen (TYLENOL) 500 MG tablet Take 500 mg by mouth every 8 (eight) hours as needed  for mild pain or moderate pain.   03/20/2014 at Unknown time  . Prenat-Fe Carbonyl-FA-Omega 3 (ONE-A-DAY WOMENS PRENATAL 1) 28-0.8-235 MG CAPS Take 1 capsule by mouth daily.   Taking    Review of Systems   Constitutional: Negative for fever and chills Eyes: Negative for visual disturbances Respiratory: Negative for shortness of breath, dyspnea Cardiovascular: Negative for chest pain or palpitations  Gastrointestinal: Negative for vomiting, diarrhea and constipation Genitourinary: Negative for dysuria and urgency Musculoskeletal: Negative for back pain, joint pain, myalgias  Neurological: Negative for dizziness and headaches     Physical Exam  Blood pressure 129/82, pulse 95, temperature 98.3 F (36.8 C), temperature source Oral, resp. rate 18, height 5\' 5"  (1.651 m), weight 187 lb (84.823 kg), last menstrual period 06/02/2013. GENERAL: Well-developed, well-nourished female in no acute distress.  LUNGS: Clear to auscultation bilaterally.  HEART: Regular rate and rhythm. ABDOMEN: Soft, nontender, nondistended, gravid.  EXTREMITIES: Nontender, no edema, 2+ distal pulses. DTR's 2+(recheck X 2 unchanged) Presentation: cephalic FHT:  Baseline rate 150 bpm   Variability moderate  Accelerations present   Decelerations none Contractions: Every 0 mins   Labs: Results for orders placed or performed during the hospital encounter of 03/21/14 (from the  past 24 hour(s))  Urinalysis, Routine w reflex microscopic   Collection Time: 03/21/14  7:12 PM  Result Value Ref Range   Color, Urine YELLOW YELLOW   APPearance CLEAR CLEAR   Specific Gravity, Urine 1.020 1.005 - 1.030   pH 7.5 5.0 - 8.0   Glucose, UA NEGATIVE NEGATIVE mg/dL   Hgb urine dipstick NEGATIVE NEGATIVE   Bilirubin Urine NEGATIVE NEGATIVE   Ketones, ur 15 (A) NEGATIVE mg/dL   Protein, ur NEGATIVE NEGATIVE mg/dL   Urobilinogen, UA 2.0 (H) 0.0 - 1.0 mg/dL   Nitrite NEGATIVE NEGATIVE   Leukocytes, UA MODERATE (A) NEGATIVE   Urine microscopic-add on   Collection Time: 03/21/14  7:12 PM  Result Value Ref Range   Squamous Epithelial / LPF MANY (A) RARE   WBC, UA 3-6 <3 WBC/hpf   RBC / HPF 0-2 <3 RBC/hpf   Bacteria, UA FEW (A) RARE   Urine-Other AMORPHOUS URATES/PHOSPHATES   Urine rapid drug screen (hosp performed)   Collection Time: 03/21/14  7:13 PM  Result Value Ref Range   Opiates NONE DETECTED NONE DETECTED   Cocaine NONE DETECTED NONE DETECTED   Benzodiazepines NONE DETECTED NONE DETECTED   Amphetamines NONE DETECTED NONE DETECTED   Tetrahydrocannabinol NONE DETECTED NONE DETECTED   Barbiturates NONE DETECTED NONE DETECTED  Group B strep by PCR   Collection Time: 03/21/14  9:00 PM  Result Value Ref Range   Group B strep by PCR NEGATIVE NEGATIVE   Imaging Studies:  Anatomy scan:  Normal anatomy, fluid normal, EFW 73%  Assessment: Kathleen Guthrieanya E Smeal is  23 y.o. [id number] at 3267w3d presents with false labor.  Plan: Labor precautions.  If undelivered, [facility]    CRESENZO-DISHMAN,Kvion Shapley 12/18/201512:26 AM

## 2014-03-21 NOTE — Discharge Instructions (Signed)
Braxton Hicks Contractions °Contractions of the uterus can occur throughout pregnancy. Contractions are not always a sign that you are in labor.  °WHAT ARE BRAXTON HICKS CONTRACTIONS?  °Contractions that occur before labor are called Braxton Hicks contractions, or false labor. Toward the end of pregnancy (32-34 weeks), these contractions can develop more often and may become more forceful. This is not true labor because these contractions do not result in opening (dilatation) and thinning of the cervix. They are sometimes difficult to tell apart from true labor because these contractions can be forceful and people have different pain tolerances. You should not feel embarrassed if you go to the hospital with false labor. Sometimes, the only way to tell if you are in true labor is for your health care provider to look for changes in the cervix. °If there are no prenatal problems or other health problems associated with the pregnancy, it is completely safe to be sent home with false labor and await the onset of true labor. °HOW CAN YOU TELL THE DIFFERENCE BETWEEN TRUE AND FALSE LABOR? °False Labor °· The contractions of false labor are usually shorter and not as hard as those of true labor.   °· The contractions are usually irregular.   °· The contractions are often felt in the front of the lower abdomen and in the groin.   °· The contractions may go away when you walk around or change positions while lying down.   °· The contractions get weaker and are shorter lasting as time goes on.   °· The contractions do not usually become progressively stronger, regular, and closer together as with true labor.   °True Labor °· Contractions in true labor last 30-70 seconds, become very regular, usually become more intense, and increase in frequency.   °· The contractions do not go away with walking.   °· The discomfort is usually felt in the top of the uterus and spreads to the lower abdomen and low back.   °· True labor can be  determined by your health care provider with an exam. This will show that the cervix is dilating and getting thinner.   °WHAT TO REMEMBER °· Keep up with your usual exercises and follow other instructions given by your health care provider.   °· Take medicines as directed by your health care provider.   °· Keep your regular prenatal appointments.   °· Eat and drink lightly if you think you are going into labor.   °· If Braxton Hicks contractions are making you uncomfortable:   °¨ Change your position from lying down or resting to walking, or from walking to resting.   °¨ Sit and rest in a tub of warm water.   °¨ Drink 2-3 glasses of water. Dehydration may cause these contractions.   °¨ Do slow and deep breathing several times an hour.   °WHEN SHOULD I SEEK IMMEDIATE MEDICAL CARE? °Seek immediate medical care if: °· Your contractions become stronger, more regular, and closer together.   °· You have fluid leaking or gushing from your vagina.   °· You have a fever.   °· You pass blood-tinged mucus.   °· You have vaginal bleeding.   °· You have continuous abdominal pain.   °· You have low back pain that you never had before.   °· You feel your baby's head pushing down and causing pelvic pressure.   °· Your baby is not moving as much as it used to.   °Document Released: 03/22/2005 Document Revised: 03/27/2013 Document Reviewed: 01/01/2013 °ExitCare® Patient Information ©2015 [employer]. This information is not intended to replace advice given to you by your health care   provider. Make sure you discuss any questions you have with your health care provider. ° °

## 2014-03-22 DIAGNOSIS — Z0489 Encounter for examination and observation for other specified reasons: Secondary | ICD-10-CM | POA: Insufficient documentation

## 2014-03-22 DIAGNOSIS — IMO0002 Reserved for concepts with insufficient information to code with codable children: Secondary | ICD-10-CM | POA: Insufficient documentation

## 2014-03-22 DIAGNOSIS — Z3A40 40 weeks gestation of pregnancy: Secondary | ICD-10-CM | POA: Insufficient documentation

## 2014-03-22 DIAGNOSIS — O34219 Maternal care for unspecified type scar from previous cesarean delivery: Secondary | ICD-10-CM | POA: Insufficient documentation

## 2014-03-22 DIAGNOSIS — O0933 Supervision of pregnancy with insufficient antenatal care, third trimester: Secondary | ICD-10-CM | POA: Insufficient documentation

## 2014-03-23 ENCOUNTER — Encounter (HOSPITAL_COMMUNITY): Payer: Self-pay | Admitting: *Deleted

## 2014-03-23 ENCOUNTER — Inpatient Hospital Stay (HOSPITAL_COMMUNITY): Payer: Self-pay | Admitting: Anesthesiology

## 2014-03-23 ENCOUNTER — Inpatient Hospital Stay (HOSPITAL_COMMUNITY)
Admission: AD | Admit: 2014-03-23 | Discharge: 2014-03-25 | DRG: 775 | Disposition: A | Discharge status: Home / Self Care | Payer: Self-pay | Source: Ambulatory Visit | Attending: Obstetrics & Gynecology | Admitting: Obstetrics & Gynecology

## 2014-03-23 ENCOUNTER — Inpatient Hospital Stay (HOSPITAL_COMMUNITY): Payer: Medicaid Other | Admitting: Anesthesiology

## 2014-03-23 DIAGNOSIS — Z3A40 40 weeks gestation of pregnancy: Secondary | ICD-10-CM | POA: Diagnosis present

## 2014-03-23 DIAGNOSIS — Z98891 History of uterine scar from previous surgery: Secondary | ICD-10-CM

## 2014-03-23 DIAGNOSIS — O480 Post-term pregnancy: Secondary | ICD-10-CM

## 2014-03-23 DIAGNOSIS — O34219 Maternal care for unspecified type scar from previous cesarean delivery: Secondary | ICD-10-CM

## 2014-03-23 LAB — RPR

## 2014-03-23 LAB — TYPE AND SCREEN
ABO/RH(D): O POS
ANTIBODY SCREEN: NEGATIVE

## 2014-03-23 LAB — CBC
HCT: 35 % — ABNORMAL LOW (ref 36.0–46.0)
HEMOGLOBIN: 11.6 g/dL — AB (ref 12.0–15.0)
MCH: 27.2 pg (ref 26.0–34.0)
MCHC: 33.1 g/dL (ref 30.0–36.0)
MCV: 82.2 fL (ref 78.0–100.0)
Platelets: 135 10*3/uL — ABNORMAL LOW (ref 150–400)
RBC: 4.26 MIL/uL (ref 3.87–5.11)
RDW: 14.2 % (ref 11.5–15.5)
WBC: 7.9 10*3/uL (ref 4.0–10.5)

## 2014-03-23 LAB — HIV ANTIBODY (ROUTINE TESTING W REFLEX): HIV: NONREACTIVE

## 2014-03-23 MED ORDER — WITCH HAZEL-GLYCERIN EX PADS: 1.0000 "application " | MEDICATED_PAD | CUTANEOUS | Status: DC | PRN

## 2014-03-23 MED ORDER — EPHEDRINE 5 MG/ML INJ
10.0000 mg | INTRAVENOUS | Status: DC | PRN
  Filled 2014-03-23: qty 2

## 2014-03-23 MED ORDER — LACTATED RINGERS IV SOLN
INTRAVENOUS | Status: DC
Start: 2014-03-23 — End: 2014-03-23
  Administered 2014-03-23 (×2): via INTRAVENOUS

## 2014-03-23 MED ORDER — PHENYLEPHRINE 40 MCG/ML (10ML) SYRINGE FOR IV PUSH (FOR BLOOD PRESSURE SUPPORT)
80.0000 ug | PREFILLED_SYRINGE | INTRAVENOUS | Status: DC | PRN
  Filled 2014-03-23: qty 10
  Filled 2014-03-23: qty 2

## 2014-03-23 MED ORDER — PRENATAL MULTIVITAMIN CH
1.0000 | ORAL_TABLET | Freq: Every day | ORAL | Status: DC
  Administered 2014-03-24 – 2014-03-25 (×2): 1 via ORAL
  Filled 2014-03-23 (×2): qty 1

## 2014-03-23 MED ORDER — SODIUM CHLORIDE 0.9 % IJ SOLN
3.0000 mL | Freq: Two times a day (BID) | INTRAMUSCULAR | Status: DC
  Administered 2014-03-24: 3 mL via INTRAVENOUS

## 2014-03-23 MED ORDER — TETANUS-DIPHTH-ACELL PERTUSSIS 5-2.5-18.5 LF-MCG/0.5 IM SUSP
0.5000 mL | Freq: Once | INTRAMUSCULAR | Status: AC
  Administered 2014-03-24: 0.5 mL via INTRAMUSCULAR
  Filled 2014-03-23: qty 0.5

## 2014-03-23 MED ORDER — SENNOSIDES-DOCUSATE SODIUM 8.6-50 MG PO TABS
2.0000 | ORAL_TABLET | ORAL | Status: DC
  Administered 2014-03-24 (×2): 2 via ORAL
  Filled 2014-03-23 (×2): qty 2

## 2014-03-23 MED ORDER — ONDANSETRON HCL 4 MG PO TABS: 4.0000 mg | ORAL_TABLET | ORAL | Status: DC | PRN

## 2014-03-23 MED ORDER — FENTANYL 2.5 MCG/ML BUPIVACAINE 1/10 % EPIDURAL INFUSION (WH - ANES)
14.0000 mL/h | INTRAMUSCULAR | Status: DC | PRN
  Administered 2014-03-23 (×2): 14 mL/h via EPIDURAL
  Filled 2014-03-23 (×2): qty 125

## 2014-03-23 MED ORDER — DIBUCAINE 1 % RE OINT: 1.0000 "application " | TOPICAL_OINTMENT | RECTAL | Status: DC | PRN

## 2014-03-23 MED ORDER — OXYTOCIN BOLUS FROM INFUSION
500.0000 mL | INTRAVENOUS | Status: DC
  Administered 2014-03-23: 500 mL via INTRAVENOUS

## 2014-03-23 MED ORDER — MEASLES, MUMPS & RUBELLA VAC ~~LOC~~ INJ
0.5000 mL | INJECTION | Freq: Once | SUBCUTANEOUS | Status: DC
  Filled 2014-03-23: qty 0.5

## 2014-03-23 MED ORDER — BISACODYL 10 MG RE SUPP
10.0000 mg | Freq: Every day | RECTAL | Status: DC | PRN
Start: 2014-03-23 — End: 2014-03-25

## 2014-03-23 MED ORDER — LIDOCAINE HCL (PF) 1 % IJ SOLN
30.0000 mL | INTRAMUSCULAR | Status: DC | PRN
Start: 2014-03-23 — End: 2014-03-25
  Administered 2014-03-23: 30 mL via SUBCUTANEOUS
  Filled 2014-03-23: qty 30

## 2014-03-23 MED ORDER — SIMETHICONE 80 MG PO CHEW: 80.0000 mg | CHEWABLE_TABLET | ORAL | Status: DC | PRN

## 2014-03-23 MED ORDER — OXYCODONE-ACETAMINOPHEN 5-325 MG PO TABS: 1.0000 | ORAL_TABLET | ORAL | Status: DC | PRN

## 2014-03-23 MED ORDER — OXYCODONE-ACETAMINOPHEN 5-325 MG PO TABS
2.0000 | ORAL_TABLET | ORAL | Status: DC | PRN
  Administered 2014-03-23 – 2014-03-24 (×3): 2 via ORAL
  Filled 2014-03-23 (×2): qty 2

## 2014-03-23 MED ORDER — ONDANSETRON HCL 4 MG/2ML IJ SOLN: 4.0000 mg | INTRAMUSCULAR | Status: DC | PRN

## 2014-03-23 MED ORDER — ONDANSETRON HCL 4 MG/2ML IJ SOLN: 4.0000 mg | Freq: Four times a day (QID) | INTRAMUSCULAR | Status: DC | PRN

## 2014-03-23 MED ORDER — MISOPROSTOL 200 MCG PO TABS
ORAL_TABLET | ORAL | Status: AC
  Administered 2014-03-23: 800 ug
  Filled 2014-03-23: qty 4

## 2014-03-23 MED ORDER — OXYCODONE-ACETAMINOPHEN 5-325 MG PO TABS
2.0000 | ORAL_TABLET | ORAL | Status: DC | PRN
  Filled 2014-03-23: qty 2

## 2014-03-23 MED ORDER — BENZOCAINE-MENTHOL 20-0.5 % EX AERO
1.0000 "application " | INHALATION_SPRAY | CUTANEOUS | Status: DC | PRN
  Administered 2014-03-24 – 2014-03-25 (×2): 1 via TOPICAL
  Filled 2014-03-23 (×2): qty 56

## 2014-03-23 MED ORDER — LACTATED RINGERS IV SOLN
500.0000 mL | Freq: Once | INTRAVENOUS | Status: AC
  Administered 2014-03-23: 1000 mL via INTRAVENOUS

## 2014-03-23 MED ORDER — ZOLPIDEM TARTRATE 5 MG PO TABS
5.0000 mg | ORAL_TABLET | Freq: Every evening | ORAL | Status: DC | PRN
Start: 2014-03-23 — End: 2014-03-25

## 2014-03-23 MED ORDER — TERBUTALINE SULFATE 1 MG/ML IJ SOLN: 0.2500 mg | Freq: Once | INTRAMUSCULAR | Status: DC | PRN

## 2014-03-23 MED ORDER — LACTATED RINGERS IV SOLN
500.0000 mL | INTRAVENOUS | Status: DC | PRN
  Administered 2014-03-23: 1000 mL via INTRAVENOUS

## 2014-03-23 MED ORDER — LIDOCAINE HCL (PF) 1 % IJ SOLN
INTRAMUSCULAR | Status: DC | PRN
  Administered 2014-03-23: 6 mL
  Administered 2014-03-23: 4 mL

## 2014-03-23 MED ORDER — DIPHENHYDRAMINE HCL 50 MG/ML IJ SOLN: 12.5000 mg | INTRAMUSCULAR | Status: DC | PRN

## 2014-03-23 MED ORDER — FENTANYL 2.5 MCG/ML BUPIVACAINE 1/10 % EPIDURAL INFUSION (WH - ANES): 14.0000 mL/h | INTRAMUSCULAR | Status: DC | PRN

## 2014-03-23 MED ORDER — LANOLIN HYDROUS EX OINT: TOPICAL_OINTMENT | CUTANEOUS | Status: DC | PRN

## 2014-03-23 MED ORDER — SODIUM CHLORIDE 0.9 % IV SOLN: 250.0000 mL | INTRAVENOUS | Status: DC | PRN

## 2014-03-23 MED ORDER — CITRIC ACID-SODIUM CITRATE 334-500 MG/5ML PO SOLN: 30.0000 mL | ORAL | Status: DC | PRN

## 2014-03-23 MED ORDER — FLEET ENEMA 7-19 GM/118ML RE ENEM: 1.0000 | ENEMA | Freq: Every day | RECTAL | Status: DC | PRN

## 2014-03-23 MED ORDER — IBUPROFEN 600 MG PO TABS
600.0000 mg | ORAL_TABLET | Freq: Four times a day (QID) | ORAL | Status: DC
  Administered 2014-03-23 – 2014-03-25 (×7): 600 mg via ORAL
  Filled 2014-03-23 (×7): qty 1

## 2014-03-23 MED ORDER — ACETAMINOPHEN 325 MG PO TABS: 650.0000 mg | ORAL_TABLET | ORAL | Status: DC | PRN

## 2014-03-23 MED ORDER — OXYTOCIN 40 UNITS IN LACTATED RINGERS INFUSION - SIMPLE MED
1.0000 m[IU]/min | INTRAVENOUS | Status: DC
  Administered 2014-03-23: 2 m[IU]/min via INTRAVENOUS
  Filled 2014-03-23: qty 1000

## 2014-03-23 MED ORDER — PHENYLEPHRINE 40 MCG/ML (10ML) SYRINGE FOR IV PUSH (FOR BLOOD PRESSURE SUPPORT)
80.0000 ug | PREFILLED_SYRINGE | INTRAVENOUS | Status: DC | PRN
  Filled 2014-03-23: qty 2

## 2014-03-23 MED ORDER — SODIUM CHLORIDE 0.9 % IJ SOLN: 3.0000 mL | INTRAMUSCULAR | Status: DC | PRN

## 2014-03-23 MED ORDER — OXYTOCIN 40 UNITS IN LACTATED RINGERS INFUSION - SIMPLE MED
62.5000 mL/h | INTRAVENOUS | Status: DC
  Administered 2014-03-23: 62.5 mL/h via INTRAVENOUS

## 2014-03-23 MED ORDER — DIPHENHYDRAMINE HCL 25 MG PO CAPS: 25.0000 mg | ORAL_CAPSULE | Freq: Four times a day (QID) | ORAL | Status: DC | PRN

## 2014-03-23 MED ORDER — OXYTOCIN 40 UNITS IN LACTATED RINGERS INFUSION - SIMPLE MED: 62.5000 mL/h | INTRAVENOUS | Status: DC | PRN

## 2014-03-23 NOTE — Progress Notes (Signed)
Membranes stripped by Dr Loreta AveAcosta when sve done

## 2014-03-23 NOTE — H&P (Signed)
LABOR ADMISSION HISTORY AND PHYSICAL  [name] is a 23 y.o. female [phone] with IUP at 6344w5d presenting for labor evaluation. She reports +FMs, No LOF, no VB, no blurry vision, headaches or peripheral edema, and RUQ pain. She desires an epidural for labor pain control. She plans on bottle feeding.  Dating: By LMP --->  Estimated Date of Delivery: 03/18/14   Prenatal History/Complications:  Past Medical History: Past Medical History  Diagnosis Date  . Panic attacks   . Vertigo   . Supervision of normal pregnancy 09/11/2013    Past Surgical History: Past Surgical History  Procedure Laterality Date  . Urethra stretched    . Cesarean section      Obstetrical History: OB History    Gravida Para Term Preterm AB TAB SAB Ectopic Multiple Living   6 4 3 1 1  1   4       Social History: History   Social History  . Marital Status: Married    Spouse Name: N/A    Number of Children: N/A  . Years of Education: N/A   Social History Main Topics  . Smoking status: Never Smoker   . Smokeless tobacco: Never Used  . Alcohol Use: No  . Drug Use: No  . Sexual Activity: Yes   Other Topics Concern  . None   Social History Narrative    Family History: Family History  Problem Relation Age of Onset  . Hypertension Mother   . Lupus Mother   . Mental illness Mother   . Bipolar disorder Mother   . ADD / ADHD Mother   . Bipolar disorder Brother   . ADD / ADHD Brother   . Thyroid disease Maternal Aunt   . Heart disease Maternal Aunt   . Lupus Maternal Aunt   . Thyroid disease Maternal Uncle   . Heart attack Maternal Uncle   . Heart disease Maternal Uncle   . Lupus Maternal Uncle   . Heart attack Maternal Grandmother   . Heart disease Maternal Grandmother   . Hypertension Maternal Grandmother   . Cancer Maternal Grandfather   . Hypertension Paternal Grandmother     Allergies: No Known Allergies  Prescriptions prior to admission  Medication Sig Dispense Refill Last  Dose  . acetaminophen (TYLENOL) 500 MG tablet Take 500 mg by mouth every 8 (eight) hours as needed for mild pain or moderate pain.   03/22/2014 at 2000  . Prenat-Fe Carbonyl-FA-Omega 3 (ONE-A-DAY WOMENS PRENATAL 1) 28-0.8-235 MG CAPS Take 1 capsule by mouth daily.   Past Month at Unknown time     Review of Systems   All systems reviewed and negative except as stated in HPI  Blood pressure 119/86, pulse 96, temperature 97.5 F (36.4 C), resp. rate 20, height 5\' 5"  (1.651 m), weight 186 lb (84.369 kg), last menstrual period 06/02/2013. General appearance: alert and cooperative Lungs: clear to auscultation bilaterally Heart: regular rate and rhythm Abdomen: soft, non-tender; bowel sounds normal Pelvic: adequate Extremities: Homans sign is negative, no sign of DVT Presentation: cephalic Fetal monitoringBaseline: 130 bpm, Variability: Good {> 6 bpm), Accelerations: Reactive and Decelerations: Absent Uterine activityirregular  Dilation: 4 Effacement (%): 80 Station: -1 Exam by:: Dr Loreta AveAcosta   Prenatal labs: ABO, Rh: O/POS/-- (06/09 1115) Antibody: NEG (06/09 1115) Rubella:   RPR: NON REAC (06/09 1115)  HBsAg: NEGATIVE (06/09 1115)  HIV: NONREACTIVE (06/09 1115)  GBS: Negative (12/17 0000)  1 hr Glucola not done, no prenatal care Genetic screening  declined  Anatomy US normal at 40w    [facility]  FOB Christen ButterKevin  Aud[place]   Dating By US  Pap 09/11/13  GC/CT Initial:                36+wks:  Genetic Screen NT/IT:   CF screen declined  Anatomic US   Flu vaccine   Tdap Recommended ~ 28wks  Glucose Screen  2 hr  GBS   Feed Preference   Contraception   Circumcision   Childbirth Classes   Pediatrician       No results found for this or any previous visit (from the past 24 hour(s)).  Patient Active Problem List   Diagnosis Date Noted  . Evaluate anatomy not seen on prior sonogram   . Late prenatal care affecting pregnancy in third trimester, antepartum   . Uterine scar  from previous cesarean delivery, antepartum   . [redacted] weeks gestation of pregnancy   . Supervision of normal pregnancy 09/11/2013    Assessment: Kathleen Guthrieanya E Whisnant is a 23 y.o. [id number] at 5744w5d here for TOLAC, patient is in early latent labor and not truly in active labor, however as pt has essentially had no prenatal care (only initial prenatal visit at 13w) will keep patient   #Labor:consented for TOLAC, advised that she will likely need augmentation as she is not truly in active labor #Pain: Epidural upon request #FWB: Cat I #ID:  GBS neg via PCR at MAU visit 2 d ago #MOF: bottle #MOC:nexplanon vs depo 1 day after discharge #Circ:  OP circ  Loralee Weitzman ROCIO 03/23/2014, 7:13 AM

## 2014-03-23 NOTE — Progress Notes (Signed)
[name] is a 23 y.o. [id number] [name] 3175w5d by ultrasound admitted for active labor  Subjective: Pt doing well.  States she is having lots of pressure in her vagina, but denies any upper abdominal pain or other complaints.    Objective: BP 110/71 mmHg  Pulse 97  Temp(Src) 97.7 F (36.5 C) (Oral)  Resp 20  Ht 5\' 5"  (1.651 m)  Wt 84.369 kg (186 lb)  BMI 30.95 kg/m2  SpO2 100%  LMP 06/02/2013   Total I/O In: -  Out: 250 [Urine:250]  FHT:  FHR: 145 bpm, variability: moderate,  accelerations:  Present,  decelerations:  Absent UC:   regular, every 6 minutes SVE:   Dilation: 9 Effacement (%): 100 Station: +2 Exam by:: Dr. Janee Mornhompson  Labs: Lab Results  Component Value Date   WBC 7.9 03/23/2014   HGB 11.6* 03/23/2014   HCT 35.0* 03/23/2014   MCV 82.2 03/23/2014   PLT 135* 03/23/2014    Assessment / Plan: Augmentation of labor, progressing well, but contractions spaced out after turning town the Pitocin, will turn Pitocin back up to 4 milliunits and watch cautiously.    Labor: Progressing, but contractions are very spaced out.  Turned down Pitocin from 4 milliunits to 2 milliunits after patient complained of pain. Preeclampsia:  no signs or symptoms of toxicity Fetal Wellbeing:  Category I Pain Control:  Epidural I/D:  n/a Anticipated MOD:  NSVD  [name], [name] 03/23/2014, 4:17 PM

## 2014-03-23 NOTE — Plan of Care (Signed)
Problem: Consults Goal: Freight forwarderocial Worker Consult ([facility]es) Outcome: Progressing Pt only had two prenatal visits, the first at [redacted] weeks gestation, the second at [redacted] weeks gestation. Pt states the reason is that her Medicaid ceased when her husband got a higher-paying job, and she had no way to pay to continue her care. She also stated she was [facility] until two days ago.

## 2014-03-23 NOTE — Progress Notes (Signed)
[facility]  Spoke with patient's nurse.  CSW will see patient after she delivers.

## 2014-03-23 NOTE — Progress Notes (Signed)
Dr Loreta AveAcosta in discussing TOLAC with pt and admission.

## 2014-03-23 NOTE — Anesthesia Procedure Notes (Signed)
Epidural Patient location during procedure: OB  Preanesthetic Checklist Completed: patient identified, site marked, surgical consent, pre-op evaluation, timeout performed, IV checked, risks and benefits discussed and monitors and equipment checked  Epidural Patient position: sitting Prep: site prepped and draped and DuraPrep Patient monitoring: continuous pulse ox and blood pressure Approach: midline Location: L3-L4 Injection technique: LOR air  Needle:  Needle type: Tuohy  Needle gauge: 17 G Needle length: 9 cm and 9 Needle insertion depth: 5 cm cm Catheter type: closed end flexible Catheter size: 19 Gauge Catheter at skin depth: 14 cm Test dose: negative  Assessment Events: blood not aspirated, injection not painful, no injection resistance, negative IV test and no paresthesia  Additional Notes Dosing of Epidural:  1st dose, through catheter ............................................Marland Kitchen.  Xylocaine 40 mg  2nd dose, through catheter, after waiting 3 minutes........Marland Kitchen.Xylocaine 60 mg    ( 1% Xylo charted as a single dose in Epic Meds for ease of charting; actual dosing was fractionated as above, for saftey's sake)  As each dose occurred, patient was free of IV sx; and patient exhibited no evidence of SA injection.  Patient is more comfortable after epidural dosed. Please see RN's note for documentation of vital signs,and FHR which are stable.  Patient reminded not to try to ambulate with numb legs, and that an RN must be present when she attempts to get up.

## 2014-03-23 NOTE — Anesthesia Preprocedure Evaluation (Signed)
Anesthesia Evaluation  Patient identified by MRN, date of birth, ID band Patient awake    Reviewed: Allergy & Precautions, H&P , Patient's Chart, lab work & pertinent test results  Airway Mallampati: II  TM Distance: >3 FB Neck ROM: full    Dental no notable dental hx.    Pulmonary  breath sounds clear to auscultation  Pulmonary exam normal       Cardiovascular Exercise Tolerance: Good Rhythm:regular Rate:Normal     Neuro/Psych    GI/Hepatic   Endo/Other    Renal/GU      Musculoskeletal   Abdominal   Peds  Hematology   Anesthesia Other Findings VBAC  Reproductive/Obstetrics                             Anesthesia Physical Anesthesia Plan  ASA: II  Anesthesia Plan: Epidural   Post-op Pain Management:    Induction:   Airway Management Planned:   Additional Equipment:   Intra-op Plan:   Post-operative Plan:   Informed Consent: I have reviewed the patients History and Physical, chart, labs and discussed the procedure including the risks, benefits and alternatives for the proposed anesthesia with the patient or authorized representative who has indicated his/her understanding and acceptance.   Dental Advisory Given  Plan Discussed with:   Anesthesia Plan Comments: (Labs checked- platelets confirmed with RN in room. Fetal heart tracing, per RN, reported to be stable enough for sitting procedure. Discussed epidural, and patient consents to the procedure:  included risk of possible headache,backache, failed block, allergic reaction, and nerve injury. This patient was asked if she had any questions or concerns before the procedure started.)        Anesthesia Quick Evaluation

## 2014-03-23 NOTE — Progress Notes (Signed)
Baby active. Permit signed for TOLAC

## 2014-03-23 NOTE — Progress Notes (Signed)
Dr Loreta AveAcosta notified of pt's admission and status. Aware [facility] due to ins changes,, ctx pattern, sve, sl elevated b/ps, reactive FHR tracing. Will obs an hour and reck cervix.

## 2014-03-23 NOTE — MAU Note (Signed)
Contractions since 0400. Denies LOF and bloody show.

## 2014-03-23 NOTE — Progress Notes (Addendum)
[name] is a 23 y.o. [id number] at 5457w5d by ultrasound admitted for active labor.  Pt is TOLAC  Subjective: Pt doing well, has epidrual in place, no pain complaints.    Objective: BP 116/72 mmHg  Pulse 87  Temp(Src) 97.5 F (36.4 C) (Oral)  Resp 18  Ht 5\' 5"  (1.651 m)  Wt 84.369 kg (186 lb)  BMI 30.95 kg/m2  SpO2 100%  LMP 06/02/2013      FHT:  FHR: 150 bpm, variability: moderate,  accelerations:  Present,  decelerations:  Absent UC:   none, regular, every 3 minutes SVE:   Dilation: 4 Effacement (%): 80 Station: -1 Exam by:: Dr. Janee Mornhompson  Labs: Lab Results  Component Value Date   WBC 7.9 03/23/2014   HGB 11.6* 03/23/2014   HCT 35.0* 03/23/2014   MCV 82.2 03/23/2014   PLT 135* 03/23/2014    Assessment / Plan: Spontaneous labor, progressing normally  Labor: Progressing normally Preeclampsia:  no signs or symptoms of toxicity Fetal Wellbeing:  Category I Pain Control:  Epidural I/D:  n/a Anticipated MOD:  NSVD  [name], McKenzie L, MD 03/23/2014, 10:28 AM  I spoke with and examined patient and agree with resident/PA/SNM's note and plan of care.  AROM clear fluid, will augment w/ pitocin if needed.  Cheral MarkerKimberly R. [name], CNM, [facility] 03/23/2014 11:44 AM

## 2014-03-23 NOTE — MAU Note (Signed)
Annabelle Harmanana RN in [facility] given report of pt's Cons[employer] Edisonadmisson

## 2014-03-24 LAB — CBC
HCT: 27.8 % — ABNORMAL LOW (ref 36.0–46.0)
HEMOGLOBIN: 9.2 g/dL — AB (ref 12.0–15.0)
MCH: 27.3 pg (ref 26.0–34.0)
MCHC: 33.1 g/dL (ref 30.0–36.0)
MCV: 82.5 fL (ref 78.0–100.0)
PLATELETS: 121 10*3/uL — AB (ref 150–400)
RBC: 3.37 MIL/uL — ABNORMAL LOW (ref 3.87–5.11)
RDW: 14.1 % (ref 11.5–15.5)
WBC: 10.2 10*3/uL (ref 4.0–10.5)

## 2014-03-24 NOTE — Progress Notes (Signed)
Clinical Social Work Department PSYCHOSOCIAL ASSESSMENT - MATERNAL/CHILD 03/24/2014  Patient:  [name], [name]  Account Number:  [id number]  Admit Date:  03/23/2014  [name]    Clinical Social Worker:  Savayah Waltrip, LCSW   Date/Time:  03/24/2014 10:15 AM  Date Referred:  03/23/2014   Referral source  Central Nursery     Referred reason  [facility]   Other referral source:    I:  FAMILY / [place] legal guardian:  PARENT  Guardian - Name Guardian - Age Guardian - Address  [name],[name] [address]  [place], [place] [address]  [name]  same as above   Other household support members/support persons Other support:    II  PSYCHOSOCIAL DATA Information Source:    Occupational hygienist Employment:   Spouse is employed   Museum/gallery curator resources:  Self Pay If [place]:   Other  Mother plans to apply for [employer] / Grade:   Maternity Care Coordinator / Child Services Coordination / Early Interventions:  Cultural issues impacting care:    III  STRENGTHS Strengths  Home prepared for Child (including basic supplies)  Adequate Resources  Supportive family/friends   Strength comment:    IV  RISK FACTORS AND CURRENT PROBLEMS Current Problem:       V  SOCIAL WORK ASSESSMENT Acknowledged order for CSW consult [facility].  Met with mother who was pleasant and receptive to social work intervention.  She is married and have 4 other dependents ages 48, 32, 83, and 52.   Spouse is employed and she communicate plans to be a stay at home mom.   Mother states that she had limited [place] because of insurance.  She had Medicaid but when her husband got a better paying job, her Medicaid was cancelled.  Mother states that she was unaware of the [facility], and did not have resources to pay for [facility].    She denies hx of mental illness or substance abuse.   Mother informed of the hospital's drug screen policy.    No  acute social concerns noted or reported at this time.  Mother informed of social work Fish farm manager.      VI SOCIAL WORK PLAN Social Work Plan  No Further Intervention Required / No Barriers to Discharge   Type of pt/family education:   Hospital's drug screen policy Provided her with contact information for [facility] financial counselor   If child protective services report - county:   If child protective services report - date:   Information/referral to community resources comment:   Other social work plan:   Will continue to monitor drug screen

## 2014-03-24 NOTE — Progress Notes (Signed)
Post Partum Day 1 Subjective: no complaints  Eating, drinking, voiding, ambulating well.  +flatus.  Lochia and pain wnl.  Denies dizziness, lightheadedness, or sob. No complaints.   Objective: Blood pressure 121/76, pulse 91, temperature 97.8 F (36.6 C), temperature source Axillary, resp. rate 20, height 5\' 5"  (1.651 m), weight 84.369 kg (186 lb), last menstrual period 06/02/2013, SpO2 100 %, unknown if currently breastfeeding.  Physical Exam:  General: alert, cooperative and no distress Lochia: appropriate Uterine Fundus: firm Incision: n/a DVT Evaluation: No evidence of DVT seen on physical exam. Negative Homan's sign. No cords or calf tenderness. No significant calf/ankle edema.   Recent Labs  03/23/14 0710 03/24/14 0613  HGB 11.6* 9.2*  HCT 35.0* 27.8*    Assessment/Plan: Plan for discharge tomorrow, offered d/c this evening d/t self-pay & worried about bill, but pt declined Bottlefeeding Planning for COCs and pp visit in clinic Can call financial services to see if there is assistance for her hospital stay   LOS: 1 day   Marge DuncansBooker, Allure Greaser Randall 03/24/2014, 8:14 AM

## 2014-03-24 NOTE — Anesthesia Postprocedure Evaluation (Signed)
  Anesthesia Post-op Note  Patient: [name]  Procedure(s) Performed: * No procedures listed *  Patient Location: PACU and Mother/Baby  Anesthesia Type:Epidural  Level of Consciousness: awake, alert  and oriented  Airway and Oxygen Therapy: Patient Spontanous Breathing  Post-op Pain: mild  Post-op Assessment: Post-op Vital signs reviewed  Post-op Vital Signs: Reviewed and stable  Last Vitals:  Filed Vitals:   03/24/14 0655  BP: 121/76  Pulse: 91  Temp: 36.6 C  Resp: 20    Complications: No apparent anesthesia complications

## 2014-03-25 ENCOUNTER — Encounter: Payer: Medicaid Other | Admitting: Family Medicine

## 2014-03-25 DIAGNOSIS — O34219 Maternal care for unspecified type scar from previous cesarean delivery: Secondary | ICD-10-CM

## 2014-03-25 MED ORDER — IBUPROFEN 600 MG PO TABS: 600.0000 mg | ORAL_TABLET | Freq: Four times a day (QID) | ORAL | Status: DC

## 2014-03-25 MED ORDER — NORGESTIMATE-ETH ESTRADIOL 0.25-35 MG-MCG PO TABS: 1.0000 | ORAL_TABLET | Freq: Every day | ORAL | Status: DC

## 2014-03-25 MED ORDER — DOCUSATE SODIUM 100 MG PO CAPS: 100.0000 mg | ORAL_CAPSULE | Freq: Two times a day (BID) | ORAL | Status: DC | PRN

## 2014-03-25 NOTE — Discharge Instructions (Signed)

## 2014-03-25 NOTE — Progress Notes (Signed)
UR chart review completed.  

## 2014-03-25 NOTE — Discharge Summary (Signed)
Obstetric Discharge Summary Reason for Admission: onset of labor Prenatal Procedures: ultrasound Intrapartum Procedures: Successful VBAC Postpartum Procedures: none Complications-Operative and Postpartum: 2nd degree perineal laceration HEMOGLOBIN  Date Value Ref Range Status  03/24/2014 9.2* 12.0 - 15.0 g/dL Final    Comment:    REPEATED TO VERIFY DELTA CHECK NOTED    HCT  Date Value Ref Range Status  03/24/2014 27.8* 36.0 - 46.0 % Final    Physical Exam:  General: alert, cooperative and no distress Lochia: appropriate Uterine Fundus: firm Incision: n/a DVT Evaluation: No evidence of DVT seen on physical exam. Negative Homan's sign. No cords or calf tenderness. No significant calf/ankle edema.  Discharge Diagnoses: Term Pregnancy-delivered  Discharge Information: Date: 03/25/2014 Activity: pelvic rest Diet: routine Medications: Ibuprofen and Colace Condition: stable Instructions: refer to practice specific booklet Discharge to: home  Contraception: OCPs.  Pt Bottle feeding so COC Rx sent to pharmacy to start in 3 weeks PP.  Follow-up Information    Follow up with [facility].   Why:  In 4-6 weeks for postpartum visit   Contact information:   [address] [place] [phone]      Newborn Data: Live born female  Birth Weight: 7 lb 5.3 oz (3325 g) APGAR: 8, 9  Home with mother.  LEFTWICH-KIRBY, Donyelle Enyeart 03/25/2014, 6:47 AM

## 2014-05-01 ENCOUNTER — Ambulatory Visit: Payer: Medicaid Other | Admitting: Obstetrics & Gynecology

## 2014-12-30 ENCOUNTER — Other Ambulatory Visit: Payer: Self-pay | Admitting: Obstetrics and Gynecology

## 2014-12-30 DIAGNOSIS — O3680X0 Pregnancy with inconclusive fetal viability, not applicable or unspecified: Secondary | ICD-10-CM

## 2014-12-31 ENCOUNTER — Other Ambulatory Visit: Payer: Self-pay

## 2015-01-16 ENCOUNTER — Other Ambulatory Visit: Payer: Self-pay

## 2015-01-30 ENCOUNTER — Other Ambulatory Visit: Payer: Self-pay

## 2015-01-30 ENCOUNTER — Other Ambulatory Visit: Payer: Self-pay | Admitting: Obstetrics and Gynecology

## 2015-01-30 ENCOUNTER — Ambulatory Visit (INDEPENDENT_AMBULATORY_CARE_PROVIDER_SITE_OTHER): Payer: Medicaid Other

## 2015-01-30 DIAGNOSIS — O3680X0 Pregnancy with inconclusive fetal viability, not applicable or unspecified: Secondary | ICD-10-CM

## 2015-01-30 DIAGNOSIS — Z3682 Encounter for antenatal screening for nuchal translucency: Secondary | ICD-10-CM

## 2015-01-30 DIAGNOSIS — Z369 Encounter for antenatal screening, unspecified: Secondary | ICD-10-CM

## 2015-01-30 NOTE — Progress Notes (Signed)
US 13+1wks,single IUP pos fht 166bpm,normal ov's bilat,NT 1.516mm,CRL 69.692mm,NB present

## 2015-02-01 LAB — MATERNAL SCREEN, INTEGRATED #1
CROWN RUMP LENGTH MAT SCREEN: 69.2 mm
GEST. AGE ON COLLECTION DATE: 13 wk
Maternal Age at EDD: 26.4 years
NUMBER OF FETUSES: 1
Nuchal Translucency (NT): 1.6 mm
PAPP-A VALUE: 516 ng/mL
Weight: 167 [lb_av]

## 2015-02-11 ENCOUNTER — Encounter: Payer: Self-pay | Admitting: Advanced Practice Midwife

## 2015-02-24 ENCOUNTER — Telehealth: Payer: Self-pay | Admitting: *Deleted

## 2015-02-24 NOTE — Telephone Encounter (Signed)
Contacted pt to r/s her New OB appt that she no showed for on 02/11/2015. Pt states her MCD was denied and asked how much would it cost for her initial pnv. Pt informed would be a $250.00 fee for her first visit and than $100.00 each visit after. Pt states she can not afford that now would need to speak with her husband and call our office back. Informed pt would speak to a provider and see if there is an alternative. Pt verbalized understanding.

## 2015-04-06 NOTE — L&D Delivery Note (Signed)
Delivery Note Pt presented in active labor. Contractions spaced out and she was started on Pitocin. At 8:32 AM a viable female was delivered via spontaneous VBAC (Presentation: Middle Occiput Anterior).  APGAR: 8, 9; weight 8 lb 14.9 oz (4050 g).   Placenta status: Intact, Spontaneous.  Cord: 3 vessels with the following complications: none.  Cord pH: not obtained  Anesthesia: Epidural  Episiotomy: None Lacerations: None Suture Repair: n/a Est. Blood Loss (mL): 250  Mom to postpartum.  Baby to Couplet care / Skin to Skin.  Jinny BlossomKaty D Mayo 08/07/2015, 9:17 AM  OB fellow attestation: Patient is a [id number] at 2830w1d who was admitted in active labor for PROM. I was gloved and present for delivery in its entirety.  Second stage of labor progressed  Complications: none  Lacerations: none  EBL: 250  Federico FlakeKimberly Niles Vernon Ariel, MD 12:20 PM

## 2015-05-27 ENCOUNTER — Encounter: Payer: Self-pay | Admitting: Women's Health

## 2015-08-06 ENCOUNTER — Inpatient Hospital Stay (HOSPITAL_COMMUNITY): Payer: Medicaid Other | Admitting: Anesthesiology

## 2015-08-06 ENCOUNTER — Encounter (HOSPITAL_COMMUNITY): Payer: Self-pay

## 2015-08-06 ENCOUNTER — Inpatient Hospital Stay (HOSPITAL_COMMUNITY)
Admission: AD | Admit: 2015-08-06 | Discharge: 2015-08-09 | DRG: 775 | Disposition: A | Discharge status: Home / Self Care | Payer: Medicaid Other | Source: Ambulatory Visit | Attending: Obstetrics & Gynecology | Admitting: Obstetrics & Gynecology

## 2015-08-06 DIAGNOSIS — O0933 Supervision of pregnancy with insufficient antenatal care, third trimester: Secondary | ICD-10-CM

## 2015-08-06 DIAGNOSIS — Z23 Encounter for immunization: Secondary | ICD-10-CM

## 2015-08-06 DIAGNOSIS — O4202 Full-term premature rupture of membranes, onset of labor within 24 hours of rupture: Principal | ICD-10-CM | POA: Diagnosis present

## 2015-08-06 DIAGNOSIS — Z3A40 40 weeks gestation of pregnancy: Secondary | ICD-10-CM | POA: Diagnosis not present

## 2015-08-06 DIAGNOSIS — O34219 Maternal care for unspecified type scar from previous cesarean delivery: Secondary | ICD-10-CM

## 2015-08-06 DIAGNOSIS — O4290 Premature rupture of membranes, unspecified as to length of time between rupture and onset of labor, unspecified weeks of gestation: Secondary | ICD-10-CM | POA: Insufficient documentation

## 2015-08-06 LAB — DIFFERENTIAL
BASOS PCT: 0 %
Basophils Absolute: 0 10*3/uL (ref 0.0–0.1)
EOS ABS: 0.1 10*3/uL (ref 0.0–0.7)
EOS PCT: 1 %
LYMPHS ABS: 1.9 10*3/uL (ref 0.7–4.0)
Lymphocytes Relative: 22 %
MONO ABS: 0.6 10*3/uL (ref 0.1–1.0)
MONOS PCT: 6 %
Neutro Abs: 6.3 10*3/uL (ref 1.7–7.7)
Neutrophils Relative %: 71 %

## 2015-08-06 LAB — CBC
HEMATOCRIT: 33 % — AB (ref 36.0–46.0)
HEMOGLOBIN: 10.9 g/dL — AB (ref 12.0–15.0)
MCH: 26 pg (ref 26.0–34.0)
MCHC: 33 g/dL (ref 30.0–36.0)
MCV: 78.8 fL (ref 78.0–100.0)
Platelets: 151 10*3/uL (ref 150–400)
RBC: 4.19 MIL/uL (ref 3.87–5.11)
RDW: 14.4 % (ref 11.5–15.5)
WBC: 9 10*3/uL (ref 4.0–10.5)

## 2015-08-06 LAB — RAPID URINE DRUG SCREEN, HOSP PERFORMED
Amphetamines: NOT DETECTED
BARBITURATES: NOT DETECTED
BENZODIAZEPINES: NOT DETECTED
Cocaine: NOT DETECTED
Opiates: POSITIVE — AB
Tetrahydrocannabinol: NOT DETECTED

## 2015-08-06 LAB — OB RESULTS CONSOLE GBS: STREP GROUP B AG: POSITIVE

## 2015-08-06 MED ORDER — PHENYLEPHRINE 40 MCG/ML (10ML) SYRINGE FOR IV PUSH (FOR BLOOD PRESSURE SUPPORT)
80.0000 ug | PREFILLED_SYRINGE | INTRAVENOUS | Status: DC | PRN
  Filled 2015-08-06: qty 5

## 2015-08-06 MED ORDER — OXYTOCIN BOLUS FROM INFUSION: 500.0000 mL | INTRAVENOUS | Status: DC

## 2015-08-06 MED ORDER — LACTATED RINGERS IV SOLN
500.0000 mL | Freq: Once | INTRAVENOUS | Status: AC
  Administered 2015-08-06: 500 mL via INTRAVENOUS

## 2015-08-06 MED ORDER — OXYCODONE-ACETAMINOPHEN 5-325 MG PO TABS: 2.0000 | ORAL_TABLET | ORAL | Status: DC | PRN

## 2015-08-06 MED ORDER — LACTATED RINGERS IV SOLN: 500.0000 mL | INTRAVENOUS | Status: DC | PRN

## 2015-08-06 MED ORDER — LACTATED RINGERS IV SOLN
INTRAVENOUS | Status: DC
  Administered 2015-08-06: 23:00:00 via INTRAVENOUS

## 2015-08-06 MED ORDER — FENTANYL CITRATE (PF) 100 MCG/2ML IJ SOLN: 100.0000 ug | INTRAMUSCULAR | Status: DC | PRN

## 2015-08-06 MED ORDER — EPHEDRINE 5 MG/ML INJ
10.0000 mg | INTRAVENOUS | Status: DC | PRN
  Filled 2015-08-06: qty 2

## 2015-08-06 MED ORDER — ONDANSETRON HCL 4 MG/2ML IJ SOLN: 4.0000 mg | Freq: Four times a day (QID) | INTRAMUSCULAR | Status: DC | PRN

## 2015-08-06 MED ORDER — ACETAMINOPHEN 325 MG PO TABS: 650.0000 mg | ORAL_TABLET | ORAL | Status: DC | PRN

## 2015-08-06 MED ORDER — DIPHENHYDRAMINE HCL 50 MG/ML IJ SOLN: 12.5000 mg | INTRAMUSCULAR | Status: DC | PRN

## 2015-08-06 MED ORDER — CITRIC ACID-SODIUM CITRATE 334-500 MG/5ML PO SOLN: 30.0000 mL | ORAL | Status: DC | PRN

## 2015-08-06 MED ORDER — LIDOCAINE HCL (PF) 1 % IJ SOLN
30.0000 mL | INTRAMUSCULAR | Status: DC | PRN
  Filled 2015-08-06: qty 30

## 2015-08-06 MED ORDER — OXYCODONE-ACETAMINOPHEN 5-325 MG PO TABS: 1.0000 | ORAL_TABLET | ORAL | Status: DC | PRN

## 2015-08-06 MED ORDER — PHENYLEPHRINE 40 MCG/ML (10ML) SYRINGE FOR IV PUSH (FOR BLOOD PRESSURE SUPPORT)
80.0000 ug | PREFILLED_SYRINGE | INTRAVENOUS | Status: DC | PRN
  Filled 2015-08-06: qty 10
  Filled 2015-08-06: qty 5

## 2015-08-06 MED ORDER — FENTANYL 2.5 MCG/ML BUPIVACAINE 1/10 % EPIDURAL INFUSION (WH - ANES)
14.0000 mL/h | INTRAMUSCULAR | Status: DC | PRN
Start: 2015-08-06 — End: 2015-08-07
  Administered 2015-08-07 (×2): 14 mL/h via EPIDURAL
  Filled 2015-08-06 (×2): qty 125

## 2015-08-06 MED ORDER — OXYTOCIN 10 UNIT/ML IJ SOLN
2.5000 [IU]/h | INTRAVENOUS | Status: DC
  Administered 2015-08-07: 39.96 [IU]/h via INTRAVENOUS

## 2015-08-06 NOTE — MAU Note (Addendum)
Patient states her ROM at 2100. Denies any bleeding. Fetus active. Little to no prenatal care.

## 2015-08-06 NOTE — H&P (Signed)
Yaakov Guthrieanya E Stakes is a 25 y.o. female [id number] @ 40.0 by 13wk scan presenting for leaking fluid since 2100 and having steadily increasing ctx all day. She had an initial prenatal visit @ [facility] Oct 2016 but none since due to financial issues. Third delivery was a C/S followed by subsequent VBAC x 2. Would like to move towards having a BTL.  History OB History    Gravida Para Term Preterm AB TAB SAB Ectopic Multiple Living   7 5 4 1 1  0 1 0 0 5     Past Medical History  Diagnosis Date  . Supervision of normal pregnancy 09/11/2013  . Panic attacks 2013    Was given Xanax; last had panic attack 2.5 yrs ago.   Past Surgical History  Procedure Laterality Date  . Urethra stretched    . Cesarean section  2011    breech; third baby   Family History: family history includes ADD / ADHD in her brother and mother; Bipolar disorder in her brother and mother; Cancer in her maternal grandfather; Heart attack in her maternal grandmother and maternal uncle; Heart disease in her maternal aunt, maternal grandmother, and maternal uncle; Hypertension in her maternal grandmother, mother, and paternal grandmother; Lupus in her maternal aunt, maternal uncle, and mother; Mental illness in her mother; Thyroid disease in her maternal aunt and maternal uncle. Social History:  reports that she has never smoked. She has never used smokeless tobacco. She reports that she does not drink alcohol or use illicit drugs.   Prenatal Transfer Tool  Maternal Diabetes: No- presumed Genetic Screening: Declined- had 1st part of Integrative Screen Maternal Ultrasounds/Referrals: Normal Fetal Ultrasounds or other Referrals:  None Maternal Substance Abuse:  No Significant Maternal Medications:  None Significant Maternal Lab Results:  None GBS PCR pend; all labs pend Other Comments:  no prenatal care since 13wk scan  ROS  Dilation: 4 Effacement (%): 80 Station: -2 Exam by:: Camelia Enganielle Simpson RN  Blood pressure 128/100, pulse  92, temperature 98.1 F (36.7 C), temperature source Oral, resp. rate 18, last menstrual period 10/07/2014, unknown if currently breastfeeding. Exam Physical Exam  Constitutional: She is oriented to person, place, and time. She appears well-developed.  HENT:  Head: Normocephalic.  Neck: Normal range of motion.  Cardiovascular: Normal rate.   Respiratory: Effort normal.  GI:  EFM 140s, +accels, no decels Ctx q 3 mins  Genitourinary:  + fluid seen, light MSF  Musculoskeletal: Normal range of motion.  Neurological: She is alert and oriented to person, place, and time.  Skin: Skin is warm and dry.  Psychiatric: She has a normal mood and affect. Her behavior is normal. Thought content normal.    Prenatal labs: (pending) ABO, Rh:   Antibody:   Rubella:   RPR:    HBsAg:    HIV:    GBS:     Assessment/Plan: IUP@40 .0wks Prev C/S w/ subsequent VBAC x 2 No prenatal care  [facility] Expectant management GBS PCR pending along w/ OB panel labs Plan UDS/SW Will have pt sign 30d BTL papers during hospitalization Anticipate SVD   Cam HaiSHAW, Zaina Jenkin CNM 08/06/2015, 10:31 PM

## 2015-08-06 NOTE — Anesthesia Preprocedure Evaluation (Signed)
Anesthesia Evaluation  Patient identified by MRN, date of birth, ID band Patient awake    Reviewed: Allergy & Precautions, NPO status , Patient's Chart, lab work & pertinent test results  Airway Mallampati: II  TM Distance: >3 FB Neck ROM: Full    Dental no notable dental hx. (+) Teeth Intact   Pulmonary neg pulmonary ROS,    Pulmonary exam normal breath sounds clear to auscultation       Cardiovascular negative cardio ROS Normal cardiovascular exam Rhythm:Regular Rate:Normal     Neuro/Psych Anxiety negative neurological ROS     GI/Hepatic negative GI ROS, Neg liver ROS,   Endo/Other  negative endocrine ROS  Renal/GU negative Renal ROS  negative genitourinary   Musculoskeletal Hx/o Scoliosis lumbar spine   Abdominal   Peds  Hematology  (+) anemia ,   Anesthesia Other Findings   Reproductive/Obstetrics (+) Pregnancy                             Anesthesia Physical Anesthesia Plan  ASA: II  Anesthesia Plan: Epidural   Post-op Pain Management:    Induction:   Airway Management Planned: Natural Airway  Additional Equipment:   Intra-op Plan:   Post-operative Plan:   Informed Consent: I have reviewed the patients History and Physical, chart, labs and discussed the procedure including the risks, benefits and alternatives for the proposed anesthesia with the patient or authorized representative who has indicated his/her understanding and acceptance.     Plan Discussed with: Anesthesiologist  Anesthesia Plan Comments:         Anesthesia Quick Evaluation

## 2015-08-07 ENCOUNTER — Encounter (HOSPITAL_COMMUNITY): Payer: Self-pay | Admitting: *Deleted

## 2015-08-07 DIAGNOSIS — Z3A40 40 weeks gestation of pregnancy: Secondary | ICD-10-CM

## 2015-08-07 DIAGNOSIS — O4202 Full-term premature rupture of membranes, onset of labor within 24 hours of rupture: Secondary | ICD-10-CM

## 2015-08-07 DIAGNOSIS — Z23 Encounter for immunization: Secondary | ICD-10-CM

## 2015-08-07 DIAGNOSIS — O34219 Maternal care for unspecified type scar from previous cesarean delivery: Secondary | ICD-10-CM

## 2015-08-07 LAB — HIV ANTIBODY (ROUTINE TESTING W REFLEX): HIV Screen 4th Generation wRfx: NONREACTIVE

## 2015-08-07 LAB — TYPE AND SCREEN
ABO/RH(D): O POS
ANTIBODY SCREEN: NEGATIVE

## 2015-08-07 LAB — HEPATITIS B SURFACE ANTIGEN: Hepatitis B Surface Ag: NEGATIVE

## 2015-08-07 LAB — RAPID HIV SCREEN (HIV 1/2 AB+AG)
HIV 1/2 ANTIBODIES: NONREACTIVE
HIV-1 P24 ANTIGEN - HIV24: NONREACTIVE

## 2015-08-07 LAB — RPR: RPR Ser Ql: NONREACTIVE

## 2015-08-07 MED ORDER — DIPHENHYDRAMINE HCL 25 MG PO CAPS: 25.0000 mg | ORAL_CAPSULE | Freq: Four times a day (QID) | ORAL | Status: DC | PRN

## 2015-08-07 MED ORDER — ACETAMINOPHEN 325 MG PO TABS
650.0000 mg | ORAL_TABLET | ORAL | Status: DC | PRN
Start: 2015-08-07 — End: 2015-08-09
  Administered 2015-08-07: 650 mg via ORAL
  Filled 2015-08-07 (×2): qty 2

## 2015-08-07 MED ORDER — COCONUT OIL OIL: 1.0000 "application " | TOPICAL_OIL | Status: DC | PRN

## 2015-08-07 MED ORDER — ONDANSETRON HCL 4 MG PO TABS: 4.0000 mg | ORAL_TABLET | ORAL | Status: DC | PRN

## 2015-08-07 MED ORDER — OXYCODONE HCL 5 MG PO TABS
5.0000 mg | ORAL_TABLET | ORAL | Status: DC | PRN
  Administered 2015-08-07 – 2015-08-08 (×2): 5 mg via ORAL
  Filled 2015-08-07 (×3): qty 1

## 2015-08-07 MED ORDER — WITCH HAZEL-GLYCERIN EX PADS
1.0000 "application " | MEDICATED_PAD | CUTANEOUS | Status: DC | PRN
  Administered 2015-08-07: 1 via TOPICAL

## 2015-08-07 MED ORDER — ZOLPIDEM TARTRATE 5 MG PO TABS: 5.0000 mg | ORAL_TABLET | Freq: Every evening | ORAL | Status: DC | PRN

## 2015-08-07 MED ORDER — SENNOSIDES-DOCUSATE SODIUM 8.6-50 MG PO TABS
2.0000 | ORAL_TABLET | ORAL | Status: DC
  Administered 2015-08-07 – 2015-08-08 (×2): 2 via ORAL
  Filled 2015-08-07 (×2): qty 2

## 2015-08-07 MED ORDER — PENICILLIN G POTASSIUM 5000000 UNITS IJ SOLR
2.5000 10*6.[IU] | INTRAVENOUS | Status: DC
  Filled 2015-08-07 (×4): qty 2.5

## 2015-08-07 MED ORDER — TETANUS-DIPHTH-ACELL PERTUSSIS 5-2.5-18.5 LF-MCG/0.5 IM SUSP
0.5000 mL | Freq: Once | INTRAMUSCULAR | Status: AC
  Administered 2015-08-09: 0.5 mL via INTRAMUSCULAR
  Filled 2015-08-07: qty 0.5

## 2015-08-07 MED ORDER — SIMETHICONE 80 MG PO CHEW: 80.0000 mg | CHEWABLE_TABLET | ORAL | Status: DC | PRN

## 2015-08-07 MED ORDER — SODIUM CHLORIDE 0.9 % IV SOLN: 2.0000 g | Freq: Once | INTRAVENOUS | Status: DC

## 2015-08-07 MED ORDER — IBUPROFEN 600 MG PO TABS
600.0000 mg | ORAL_TABLET | Freq: Four times a day (QID) | ORAL | Status: DC
  Administered 2015-08-07 – 2015-08-09 (×8): 600 mg via ORAL
  Filled 2015-08-07 (×8): qty 1

## 2015-08-07 MED ORDER — MISOPROSTOL 200 MCG PO TABS
600.0000 ug | ORAL_TABLET | Freq: Once | ORAL | Status: AC
  Administered 2015-08-07: 600 ug via BUCCAL

## 2015-08-07 MED ORDER — DIBUCAINE 1 % RE OINT
1.0000 "application " | TOPICAL_OINTMENT | RECTAL | Status: DC | PRN
  Administered 2015-08-07: 1 via RECTAL
  Filled 2015-08-07: qty 28

## 2015-08-07 MED ORDER — PRENATAL MULTIVITAMIN CH
1.0000 | ORAL_TABLET | Freq: Every day | ORAL | Status: DC
  Administered 2015-08-07 – 2015-08-08 (×2): 1 via ORAL
  Filled 2015-08-07 (×2): qty 1

## 2015-08-07 MED ORDER — PENICILLIN G POTASSIUM 5000000 UNITS IJ SOLR
2.5000 10*6.[IU] | INTRAVENOUS | Status: DC
  Administered 2015-08-07: 2.5 10*6.[IU] via INTRAVENOUS
  Filled 2015-08-07 (×3): qty 2.5

## 2015-08-07 MED ORDER — MISOPROSTOL 200 MCG PO TABS
ORAL_TABLET | ORAL | Status: AC
  Filled 2015-08-07: qty 3

## 2015-08-07 MED ORDER — LIDOCAINE HCL (PF) 1 % IJ SOLN
INTRAMUSCULAR | Status: DC | PRN
  Administered 2015-08-07 (×2): 4 mL via EPIDURAL

## 2015-08-07 MED ORDER — ONDANSETRON HCL 4 MG/2ML IJ SOLN: 4.0000 mg | INTRAMUSCULAR | Status: DC | PRN

## 2015-08-07 MED ORDER — PENICILLIN G POTASSIUM 5000000 UNITS IJ SOLR
5.0000 10*6.[IU] | Freq: Once | INTRAVENOUS | Status: AC
  Administered 2015-08-07: 5 10*6.[IU] via INTRAVENOUS
  Filled 2015-08-07: qty 5

## 2015-08-07 MED ORDER — TERBUTALINE SULFATE 1 MG/ML IJ SOLN
0.2500 mg | Freq: Once | INTRAMUSCULAR | Status: DC | PRN
  Filled 2015-08-07: qty 1

## 2015-08-07 MED ORDER — BENZOCAINE-MENTHOL 20-0.5 % EX AERO
1.0000 "application " | INHALATION_SPRAY | CUTANEOUS | Status: DC | PRN
  Administered 2015-08-07: 1 via TOPICAL
  Filled 2015-08-07: qty 56

## 2015-08-07 MED ORDER — OXYTOCIN 10 UNIT/ML IJ SOLN
1.0000 m[IU]/min | INTRAVENOUS | Status: DC
  Administered 2015-08-07: 2 m[IU]/min via INTRAVENOUS
  Filled 2015-08-07: qty 4

## 2015-08-07 NOTE — Anesthesia Postprocedure Evaluation (Signed)
Anesthesia Post Note  Patient: [name]  Procedure(s) Performed: * No procedures listed *  Patient location during evaluation: Mother Baby Anesthesia Type: Epidural Level of consciousness: awake Pain management: pain level controlled Vital Signs Assessment: post-procedure vital signs reviewed and stable Respiratory status: spontaneous breathing Cardiovascular status: stable Postop Assessment: no headache, no backache, epidural receding, patient able to bend at knees, no signs of nausea or vomiting and adequate PO intake Anesthetic complications: no     Last Vitals:  Filed Vitals:   08/07/15 1100 08/07/15 1459  BP: 123/74 117/75  Pulse: 79 86  Temp: 37.2 C 36.6 C  Resp: 20 18    Last Pain:  Filed Vitals:   08/07/15 1502  PainSc: 9    Pain Goal: Patients Stated Pain Goal: 5 (08/07/15 0236)               Chantele Corado

## 2015-08-07 NOTE — Anesthesia Procedure Notes (Signed)
Epidural Patient location during procedure: OB Start time: 08/07/2015 12:17 AM  Staffing Anesthesiologist: Mal AmabileFOSTER, Rory Montel Performed by: anesthesiologist   Preanesthetic Checklist Completed: patient identified, site marked, surgical consent, pre-op evaluation, timeout performed, IV checked, risks and benefits discussed and monitors and equipment checked  Epidural Patient position: sitting Prep: site prepped and draped and DuraPrep Patient monitoring: continuous pulse ox and blood pressure Approach: midline Location: L4-L5 Injection technique: LOR air  Needle:  Needle type: Tuohy  Needle gauge: 17 G Needle length: 9 cm and 9 Needle insertion depth: 8 cm Catheter type: closed end flexible Catheter size: 19 Gauge Catheter at skin depth: 13 cm Test dose: negative and Other  Assessment Events: blood not aspirated, injection not painful, no injection resistance, negative IV test and no paresthesia  Additional Notes Patient identified. Risks and benefits discussed including failed block, incomplete  Pain control, post dural puncture headache, nerve damage, paralysis, blood pressure Changes, nausea, vomiting, reactions to medications-both toxic and allergic and post Partum back pain. All questions were answered. Patient expressed understanding and wished to proceed. Sterile technique was used throughout procedure. Epidural site was Dressed with sterile barrier dressing. No paresthesias, signs of intravascular injection Or signs of intrathecal spread were encountered. Multiple attempts due to extremely poor patient positioning and movement. Patient was more comfortable after the epidural was dosed. Please see RN's note for documentation of vital signs and FHR which are stable.

## 2015-08-08 ENCOUNTER — Encounter (HOSPITAL_COMMUNITY): Payer: Self-pay | Admitting: Obstetrics & Gynecology

## 2015-08-08 LAB — GROUP B STREP BY PCR: Group B strep by PCR: POSITIVE — AB

## 2015-08-08 NOTE — Progress Notes (Signed)
Post Partum Day 1 Subjective: no complaints, up ad lib, voiding and tolerating PO, small lochia, plans to bottle feed, bilateral tubal ligation  Objective: Blood pressure 115/75, pulse 59, temperature 98.1 F (36.7 C), temperature source Oral, resp. rate 18, height 5\' 5"  (1.651 m), weight 81.194 kg (179 lb), last menstrual period 10/07/2014, SpO2 98 %, unknown if currently breastfeeding.  Physical Exam:  General: alert, cooperative and no distress Lochia:normal flow Chest: CTAB Heart: RRR no m/r/g Abdomen: +BS, soft, nontender,  Uterine Fundus: firm DVT Evaluation: No evidence of DVT seen on physical exam. Extremities: trace edema   Recent Labs  08/06/15 2220  HGB 10.9*  HCT 33.0*    Assessment/Plan:   Plan for discharge tomorrow, Social Work consult and Contraception BTL--message left for [place] or Rikki SpearingMarni to get papers signed today. Plans to F/U with [facility]   LOS: 2 days   CRESENZO-DISHMAN,Lamarion Mcevers 08/08/2015, 6:41 AM

## 2015-08-08 NOTE — Progress Notes (Signed)
UR chart review completed.  

## 2015-08-09 MED ORDER — ACETAMINOPHEN 325 MG PO TABS: 650.0000 mg | ORAL_TABLET | ORAL | Status: DC | PRN

## 2015-08-09 MED ORDER — IBUPROFEN 600 MG PO TABS
600.0000 mg | ORAL_TABLET | Freq: Four times a day (QID) | ORAL | Status: DC
Start: 2015-08-09 — End: 2017-02-07

## 2015-08-09 NOTE — Discharge Instructions (Signed)

## 2015-08-09 NOTE — Discharge Summary (Signed)
OB Discharge Summary     Patient Name: [name] DOB: 01/19/1991 MRN: [id number]  Date of admission: 08/06/2015 Delivering MD: Lyndel SafeNEWTON, Roanne Haye NILES   Date of discharge: 08/09/2015  Admitting diagnosis: 40w water broke, ctx 8 min apart, a lot of pressure Intrauterine pregnancy: 5846w1d     Secondary diagnosis:  Active Problems:   Late prenatal care affecting pregnancy in third trimester, antepartum   Uterine scar from previous cesarean delivery, antepartum   VBAC (vaginal birth after Cesarean)  Additional problems: none     Discharge diagnosis: VBAC                                                                                                Post partum procedures:none  Augmentation: Pitocin  Complications: None  Hospital course:  Onset of Labor With Vaginal Delivery     25 y.o. yo [id number] at 6546w1d was admitted in Active Labor on 08/06/2015 with PROM. Patient had an uncomplicated labor course as follows: Labor was augmented with pitocin. She progressed to complete and had a successful VBAC.  Membrane Rupture Time/Date: 9:06 PM ,08/06/2015   Intrapartum Procedures: Episiotomy: None [1]                                         Lacerations:  None [1]  Patient had a delivery of a Viable infant. 08/07/2015  Information for the patient's newborn:  Dan HumphreysMoore, Girl Aaryanna [[id number]][[id number]]  Delivery Method: VBAC, Spontaneous (Filed from Delivery Summary)    Pateint had an uncomplicated postpartum course.  She is ambulating, tolerating a regular diet, passing flatus, and urinating well. Patient is discharged home in stable condition on 08/09/2015.    Physical exam  Filed Vitals:   08/08/15 0900 08/08/15 1825 08/08/15 1853 08/09/15 0500  BP:  131/82 134/87 125/88  Pulse:  63 72 63  Temp:  98 F (36.7 C) 98.2 F (36.8 C) 97.7 F (36.5 C)  TempSrc:  Oral Oral Oral  Resp: 18 18 18 16   Height:      Weight:      SpO2:       General: alert, cooperative and no distress Lochia:  appropriate Uterine Fundus: firm Incision: N/A DVT Evaluation: No evidence of DVT seen on physical exam. Labs: Lab Results  Component Value Date   WBC 9.0 08/06/2015   HGB 10.9* 08/06/2015   HCT 33.0* 08/06/2015   MCV 78.8 08/06/2015   PLT 151 08/06/2015   CMP Latest Ref Rng 12/09/2011  Glucose 70 - 99 mg/dL 130(Q106(H)  BUN 6 - 23 mg/dL 14  Creatinine 6.570.50 - 8.461.10 mg/dL 9.620.62  Sodium 952135 - 841145 mEq/L 139  Potassium 3.5 - 5.1 mEq/L 3.9  Chloride 96 - 112 mEq/L 103  CO2 19 - 32 mEq/L 22  Calcium 8.4 - 10.5 mg/dL 9.7    Discharge instruction: per After Visit Summary and "Baby and Me Booklet".  After visit meds:    Medication List    TAKE these medications  acetaminophen 325 MG tablet  Commonly known as:  TYLENOL  Take 2 tablets (650 mg total) by mouth every 4 (four) hours as needed (for pain scale < 4).     ibuprofen 600 MG tablet  Commonly known as:  ADVIL,MOTRIN  Take 1 tablet (600 mg total) by mouth every 6 (six) hours.     prenatal multivitamin Tabs tablet  Take 1 tablet by mouth daily at 12 noon.        Diet: routine diet  Activity: Advance as tolerated. Pelvic rest for 6 weeks.   Outpatient follow up: 2 weeks to discuss BTL Follow up Appt:No future appointments. Follow up Visit:No Follow-up on file.  Postpartum contraception: patient desires BTL and signed papers during admission  Newborn Data: Live born female  Birth Weight: 8 lb 14.9 oz (4050 g) APGAR: 8, 9  Baby Feeding: Bottle Disposition:home with mother   08/09/2015 Caesar Chestnut, MD   CNM attestation I have seen and examined this patient and agree with above documentation in the resident's note.   RON JUNCO is a 25 y.o. [phone] s/p VBAC.   Pain is well controlled.  Plan for birth control is bilateral tubal ligation.  Method of Feeding: bottle  PE:  BP 125/88 mmHg  Pulse 63  Temp(Src) 97.7 F (36.5 C) (Oral)  Resp 16  Ht  (1.651 m)  Wt 81.194 kg (179 lb)  BMI 29.79 kg/m2   SpO2 98%  LMP 10/07/2014 (Approximate)  Breastfeeding? Unknown Fundus firm  No results for input(s): HGB, HCT in the last 72 hours.   Plan: discharge today - postpartum care discussed - f/u clinic in 6 weeks for postpartum visit   Emmajane Altamura, CNM 10:18 PM

## 2015-08-09 NOTE — Clinical Social Work Maternal (Signed)
  CLINICAL SOCIAL WORK MATERNAL/CHILD NOTE  Patient Details  Name: [name] MRN: [id number] Date of Birth: 18-Jul-1990  Date:  08/09/2015  Clinical Social Worker Initiating Note:  Loren Racer, [place] Date/ Time Initiated:  08/09/15/1004     Child's Name:  Reece Agar   Legal Guardian:  Mother   Need for Interpreter:  None   Date of Referral:  08/09/15     Reason for Referral:  Late or No Prenatal Care , Current Substance Use/Substance Use During Pregnancy    Referral Source:  CNM   Address:  [address], [place]  Phone number:  [phone]   Household Members:  Minor Children, Self, Spouse, Parents   Natural Supports (not living in the home):  Extended Family   Professional Supports:     Employment: Agricultural engineer, Animator   Type of Work: FOB works fulltime in [place], [place]   Education:  [employer] Herbalist Resources:  Medicaid   Other Resources:  Physicist, medical , [place] Considerations Which May Impact Care:  none noted  Strengths:  Ability to meet basic needs , Compliance with medical plan , Home prepared for child , Pediatrician chosen , Other (Comment)  MOB appears very engaged and has all baby items, aware of follow up re bilirubin.   Risk Factors/Current Problems:  None   Cognitive State:  Able to Concentrate , Alert , Insightful , Linear Thinking    Mood/Affect:  Happy , Bright , Comfortable    CSW Assessment:  CSW met with MOB in her room to assess needs. MOB lives with FOB, [place] and their five girls. MOB reports her husband works in [facility] [facility] and family moved there briefly during her pregnancy and she did not pursue local [place] while there. CSW discussed positive opiates. MOB admits to taking a percocet during early labor prior to arrival at hospital. MOB made aware of [facility] policy and that drug screens were sent. She was very appropriate and denied other use. MOB seems to have good supports and plan in place  for baby.   CSW Plan/Description:  No Further Intervention Required/No Barriers to Discharge, Patient/Family Education     Armstead Peaks,  08/09/2015, 10:11 AM

## 2015-08-09 NOTE — Clinical Social Work Maternal (Signed)
  CLINICAL SOCIAL WORK MATERNAL/CHILD NOTE  Patient Details  Name: [name] MRN: [id number] Date of Birth: 07-26-1990  Date:  08/09/2015  Clinical Social Worker Initiating Note:  Loren Racer, [place] Date/ Time Initiated:  08/09/15/1004     Child's Name:  Reece Agar   Legal Guardian:  Mother   Need for Interpreter:  None   Date of Referral:  08/09/15     Reason for Referral:  Late or No Prenatal Care , Current Substance Use/Substance Use During Pregnancy    Referral Source:  CNM   Address:  [address], [place]  Phone number:  [phone]   Household Members:  Minor Children, Self, Spouse, Parents   Natural Supports (not living in the home):  Extended Family   Professional Supports:     Employment: Agricultural engineer, Animator   Type of Work: FOB works fulltime in [place], [place]   Education:  [employer] school Herbalist Resources:  Medicaid   Other Resources:  Physicist, medical , [place] Considerations Which May Impact Care:  none  Strengths:  Ability to meet basic needs , Compliance with medical plan , Home prepared for child , Pediatrician chosen , Other (Comment)   Risk Factors/Current Problems:  None   Cognitive State:  Able to Concentrate , Alert , Insightful , Linear Thinking    Mood/Affect:  Happy , Bright , Comfortable    CSW Assessment: CSW met with MOB in her room to complete assessment. MOB very attentive to baby, Aryanna. MOB and FOB live with their 5 girls ages 24yo, 27yo, 27yo, 27 yo and 48monthold. They report to have good support from extended family and MGM will assist on d/c. [place] and [facility] [place] [facility] [place] very aware of needed follow up fo baby. She seems concerned about bili blanket and is open to needed follow up. MOB made aware of [facility]. She stated understanding. MOB admits to  percocet use prior to arriving at hospital. MOB denies other drug use.   CSW Plan/Description:  No Further Intervention Required/No Barriers to Discharge, Patient/Family Education     [place] Peaks [place]5/09/2015, 10:18 AM

## 2015-08-11 LAB — RUBELLA SCREEN: Rubella: 1.26 index (ref >0.99)

## 2015-08-19 ENCOUNTER — Encounter: Payer: Self-pay | Admitting: *Deleted

## 2015-08-20 ENCOUNTER — Encounter: Payer: Self-pay | Admitting: Obstetrics & Gynecology

## 2015-08-20 ENCOUNTER — Encounter: Payer: Medicaid Other | Admitting: Obstetrics & Gynecology

## 2015-09-04 ENCOUNTER — Encounter: Payer: Self-pay | Admitting: Advanced Practice Midwife

## 2015-09-04 ENCOUNTER — Ambulatory Visit (INDEPENDENT_AMBULATORY_CARE_PROVIDER_SITE_OTHER): Payer: Medicaid Other | Admitting: Advanced Practice Midwife

## 2015-09-04 NOTE — Progress Notes (Signed)
  [name] is a 25 y.o. who presents for a postpartum visit. She is 4 weeks postpartum following a spontaneous vaginal delivery. I have fully reviewed the prenatal and intrapartum course. The delivery was at 40.1 gestational weeks.  Anesthesia: epidural. Postpartum course has been uneventful. Baby's course has been uneventful. Baby is feeding by bottle. Bleeding: staining only. Bowel function is normal. Bladder function is normal. Patient is not sexually active. Contraception method is none. Postpartum depression screening: negative.   Current outpatient prescriptions:  .  acetaminophen (TYLENOL) 325 MG tablet, Take 2 tablets (650 mg total) by mouth every 4 (four) hours as needed (for pain scale < 4). (Patient not taking: Reported on 09/04/2015), Disp: 50 tablet, Rfl: 0 .  ibuprofen (ADVIL,MOTRIN) 600 MG tablet, Take 1 tablet (600 mg total) by mouth every 6 (six) hours. (Patient not taking: Reported on 09/04/2015), Disp: 50 tablet, Rfl: 0 .  Prenatal Vit-Fe Fumarate-FA (PRENATAL MULTIVITAMIN) TABS tablet, Take 1 tablet by mouth daily at 12 noon. Reported on 09/04/2015, Disp: , Rfl:   Review of Systems   Constitutional: Negative for fever and chills Eyes: Negative for visual disturbances Respiratory: Negative for shortness of breath, dyspnea Cardiovascular: Negative for chest pain or palpitations  Gastrointestinal: Negative for vomiting, diarrhea and constipation Genitourinary: Negative for dysuria and urgency Musculoskeletal: Negative for back pain, joint pain, myalgias  Neurological: Negative for dizziness and headaches   Objective:     Filed Vitals:   09/04/15 1147  BP: 118/72  Pulse: 68   General:  alert, cooperative and no distress   Breasts:  negative  Lungs: clear to auscultation bilaterally  Heart:  regular rate and rhythm  Abdomen: Soft, nontender   Vulva:  normal  Vagina: normal vagina  Cervix:  closed  Corpus: Well involuted     Rectal Exam: no hemorrhoids         Assessment:    normal postpartum exam.  Plan:    1. Contraception: tubal ligation papers signed 09/04/2015  2. Follow up in:   A few weeks for preop or as needed.

## 2015-09-15 ENCOUNTER — Encounter: Payer: Self-pay | Admitting: Obstetrics & Gynecology

## 2015-09-15 ENCOUNTER — Encounter: Payer: Medicaid Other | Admitting: Obstetrics & Gynecology

## 2015-10-20 ENCOUNTER — Encounter: Payer: Medicaid Other | Admitting: Obstetrics & Gynecology

## 2015-10-20 ENCOUNTER — Encounter: Payer: Self-pay | Admitting: Obstetrics & Gynecology

## 2016-04-05 NOTE — L&D Delivery Note (Signed)
Patient is a 26 y.o. now G8P7 s/p NSVD at 1053w4d, who was admitted for SOL.  She progressed with augmentation (pitocin and AROM) to complete and pushed 5minutes to deliver.  Cord clamping delayed by several minutes then clamped by CNM and cut by FOB.  Placenta delivered intact via [place] and spontaneous, bleeding minimal. Mom and baby stable prior to transfer to postpartum. She plans on breastfeeding. She requests BTL for birth control that is scheduled for today @ 1630.  Delivery Note At 2:38 PM a viable female was delivered via VBAC, Spontaneous (Presentation: direct OA ).  APGAR: 6, 7; weight pending .   [facility] with no complications. 3V Cord: Nursery present after delivery for infant with increased mucus and grunting at 10 minutes of life.  Anesthesia:  Epidural  Episiotomy: None Lacerations: None  Est. Blood Loss (mL):  100mL  Mom to postpartum.  Baby to Couplet care / Skin to Skin.  Sharyon CableVeronica C Nedim Oki CNM 03/23/2017, 3:11 PM

## 2016-08-23 ENCOUNTER — Ambulatory Visit: Payer: Medicaid Other

## 2016-09-01 ENCOUNTER — Other Ambulatory Visit: Payer: Self-pay | Admitting: Obstetrics and Gynecology

## 2016-09-01 DIAGNOSIS — O3680X0 Pregnancy with inconclusive fetal viability, not applicable or unspecified: Secondary | ICD-10-CM

## 2016-09-02 ENCOUNTER — Ambulatory Visit (INDEPENDENT_AMBULATORY_CARE_PROVIDER_SITE_OTHER): Payer: Medicaid Other

## 2016-09-02 DIAGNOSIS — O3680X0 Pregnancy with inconclusive fetal viability, not applicable or unspecified: Secondary | ICD-10-CM

## 2016-09-02 NOTE — Progress Notes (Signed)
US 10+5 wks,single IUP,pos fht 161 bpm,normal ovaries bilat,CRL 38.2 mm,EDD 03/26/2017

## 2016-09-17 ENCOUNTER — Ambulatory Visit: Payer: Medicaid Other | Admitting: *Deleted

## 2016-09-17 ENCOUNTER — Encounter: Payer: Medicaid Other | Admitting: Women's Health

## 2016-09-28 ENCOUNTER — Encounter: Payer: Medicaid Other | Admitting: Women's Health

## 2016-09-28 ENCOUNTER — Ambulatory Visit: Payer: Medicaid Other | Admitting: *Deleted

## 2016-11-15 ENCOUNTER — Encounter: Payer: Medicaid Other | Admitting: Certified Nurse Midwife

## 2016-11-30 ENCOUNTER — Ambulatory Visit: Payer: Medicaid Other | Admitting: *Deleted

## 2016-11-30 ENCOUNTER — Encounter: Payer: Self-pay | Admitting: Advanced Practice Midwife

## 2016-11-30 ENCOUNTER — Other Ambulatory Visit (HOSPITAL_COMMUNITY)
Admission: RE | Admit: 2016-11-30 | Discharge: 2016-11-30 | Disposition: A | Discharge status: Home / Self Care | Payer: Medicaid Other | Source: Ambulatory Visit | Attending: Advanced Practice Midwife | Admitting: Advanced Practice Midwife

## 2016-11-30 ENCOUNTER — Ambulatory Visit (INDEPENDENT_AMBULATORY_CARE_PROVIDER_SITE_OTHER): Payer: Medicaid Other | Admitting: Advanced Practice Midwife

## 2016-11-30 VITALS — BP 112/76 | HR 80 | Wt 184.0 lb

## 2016-11-30 DIAGNOSIS — O0932 Supervision of pregnancy with insufficient antenatal care, second trimester: Secondary | ICD-10-CM | POA: Diagnosis not present

## 2016-11-30 DIAGNOSIS — Z3482 Encounter for supervision of other normal pregnancy, second trimester: Secondary | ICD-10-CM | POA: Diagnosis present

## 2016-11-30 DIAGNOSIS — Z363 Encounter for antenatal screening for malformations: Secondary | ICD-10-CM

## 2016-11-30 DIAGNOSIS — F1120 Opioid dependence, uncomplicated: Secondary | ICD-10-CM

## 2016-11-30 DIAGNOSIS — Z331 Pregnant state, incidental: Secondary | ICD-10-CM

## 2016-11-30 DIAGNOSIS — Z1389 Encounter for screening for other disorder: Secondary | ICD-10-CM

## 2016-11-30 DIAGNOSIS — Z79891 Long term (current) use of opiate analgesic: Secondary | ICD-10-CM

## 2016-11-30 DIAGNOSIS — O99322 Drug use complicating pregnancy, second trimester: Secondary | ICD-10-CM | POA: Diagnosis not present

## 2016-11-30 DIAGNOSIS — Z124 Encounter for screening for malignant neoplasm of cervix: Secondary | ICD-10-CM | POA: Diagnosis not present

## 2016-11-30 DIAGNOSIS — O09292 Supervision of pregnancy with other poor reproductive or obstetric history, second trimester: Secondary | ICD-10-CM | POA: Diagnosis not present

## 2016-11-30 DIAGNOSIS — Z3A23 23 weeks gestation of pregnancy: Secondary | ICD-10-CM | POA: Diagnosis not present

## 2016-11-30 DIAGNOSIS — O34219 Maternal care for unspecified type scar from previous cesarean delivery: Secondary | ICD-10-CM

## 2016-11-30 DIAGNOSIS — Z8632 Personal history of gestational diabetes: Secondary | ICD-10-CM | POA: Diagnosis not present

## 2016-11-30 DIAGNOSIS — O34212 Maternal care for vertical scar from previous cesarean delivery: Secondary | ICD-10-CM

## 2016-11-30 DIAGNOSIS — O0930 Supervision of pregnancy with insufficient antenatal care, unspecified trimester: Secondary | ICD-10-CM

## 2016-11-30 DIAGNOSIS — O0990 Supervision of high risk pregnancy, unspecified, unspecified trimester: Secondary | ICD-10-CM | POA: Insufficient documentation

## 2016-11-30 DIAGNOSIS — O99320 Drug use complicating pregnancy, unspecified trimester: Secondary | ICD-10-CM

## 2016-11-30 DIAGNOSIS — O09299 Supervision of pregnancy with other poor reproductive or obstetric history, unspecified trimester: Secondary | ICD-10-CM | POA: Insufficient documentation

## 2016-11-30 NOTE — Patient Instructions (Signed)

## 2016-11-30 NOTE — Progress Notes (Signed)
Subjective:    [name] is a [id number] [redacted]w[redacted]d being seen today for her first obstetrical visit.  Her obstetrical history is significant for cs/3 VBACS.  Had a 36-ish week PTD with 2nd child in 2009, has had 4 full term deliveries since then.  Discussed 170 and declined.  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  Goes to weekly counseling as part of the program and feels as if she is doing well.  Seems motivated to stay clean and wants to get off of subutex.  Discussed weaning and not to try to stop cold [place] and that she must let her subutex prescriber know her wishes.  Right now she is taking 4mg  BID although prescribed 8mg  tiD.  Worried that if she tells her pre scriber that she will kick her out of the program (has a friend that this happened to) and she feels she needs the counseling and support to stay clean.  Commended for and encourgaged her motivation.   Had "terrible" experience getting epidural last time(took 4 sticks) and pt very anxious about this.  Wanted a CS in order to avoid, but when discussed that she wouldn't be put to sleep, decided on TOLAC.  Had benadryl IV (for itching) and remembers being very calm as a side effect.  Request benadryl prior to epidural insertion to help with the anxiety, and I think this is a reasonable request.     Patient reports no complaints.  Vitals:   11/30/16 1536  BP: 112/76  Pulse: 80  Weight: 184 lb (83.5 kg)    HISTORY: OB History  Gravida Para Term Preterm AB Living  8 6 5 1 1 6   SAB TAB Ectopic Multiple Live Births  1 0 0 0 6    # Outcome Date GA Lbr Len/2nd Weight Sex Delivery Anes PTL Lv  8 Current           7 Term 08/07/15 [redacted]w[redacted]d 11:59 / 00:33 8 lb 14.9 oz (4.05 kg) F VBAC EPI N LIV  6 Term 03/23/14 [redacted]w[redacted]d 13:22 / 00:54 7 lb 5.3 oz (3.325 kg) F Vag-Spont Local, EPI N LIV     Birth Comments: caput  5 SAB 2013 [redacted]w[redacted]d         4 Term 08/18/10 [redacted]w[redacted]d  7 lb (3.175 kg) F VBAC EPI  LIV     Complications:  Gestational diabetes  3 Term 08/12/09 [redacted]w[redacted]d  6 lb 11 oz (3.033 kg) F CS-LTranv EPI N LIV     Complications: Breech presentation  2 Preterm 03/26/08 [redacted]w[redacted]d  5 lb 12 oz (2.608 kg) F Vag-Spont EPI N LIV  1 Term 02/07/04 [redacted]w[redacted]d  5 lb 5 oz (2.41 kg) F Vag-Spont EPI N LIV     Past Medical History:  Diagnosis Date  . Gestational diabetes   . Panic attacks 2013   Was given Xanax; last had panic attack 2.5 yrs ago.  Marland Kitchen Scoliosis   . Supervision of normal pregnancy 09/11/2013   Past Surgical History:  Procedure Laterality Date  . CESAREAN SECTION  2011   breech; third baby  . urethra stretched     Family History  Problem Relation Age of Onset  . Hypertension Mother   . Lupus Mother   . Mental illness Mother   . Bipolar disorder Mother   . ADD / ADHD Mother   . Bipolar disorder Brother   . ADD / ADHD Brother   . Heart attack Maternal Grandmother   .  Heart disease Maternal Grandmother   . Hypertension Maternal Grandmother   . Cancer Maternal Grandfather   . Hypertension Paternal Grandmother   . Thyroid disease Maternal Aunt   . Heart disease Maternal Aunt   . Lupus Maternal Aunt   . Thyroid disease Maternal Uncle   . Heart attack Maternal Uncle   . Heart disease Maternal Uncle   . Lupus Maternal Uncle      Exam       Pelvic Exam:    Perineum: Normal Perineum   Vulva: normal   Vagina:  normal mucosa, normal discharge, no palpable nodules   Uterus Normal, Gravid, FH: 23     Cervix: Normal. Pap collected   Adnexa: Not palpable   Urinary:  urethral meatus normal    System:     Skin: normal coloration and turgor, no rashes    Neurologic: oriented, normal, normal mood   Extremities: normal strength, tone, and muscle mass   HEENT PERRLA   Mouth/Teeth mucous membranes moist, good dentition   Neck supple and no masses   Cardiovascular: regular rate and rhythm   Respiratory:  appears well, vitals normal, no respiratory distress, acyanotic   Abdomen: soft, non-tender;  FHR:  150          Assessment:    Pregnancy: [id number] Patient Active Problem List   Diagnosis Date Noted  . Late prenatal care 12/01/2016  . Supervision of normal pregnancy 11/30/2016  . History of gestational diabetes in prior pregnancy, currently pregnant 11/30/2016  . 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 VBAC (vaginal birth after Cesarean) 08/07/2015  . Previous cesarean section 03/23/2014        Plan:     Initial labs drawn. Continue prenatal vitamins  Problem list reviewed and updated  Too late for early Gtt Reviewed recommended weight gain based on pre-gravid BMI  Encouraged well-balanced diet Genetic Screening discussed : too late.  Ultrasound discussed; fetal survey: ordered.  Return for asap for anatomy scan only; 4 weeks for LROB/PN2.  CRESENZO-DISHMAN,Dannel Rafter 12/01/2016

## 2016-12-01 ENCOUNTER — Encounter: Payer: Self-pay | Admitting: Advanced Practice Midwife

## 2016-12-01 DIAGNOSIS — O0930 Supervision of pregnancy with insufficient antenatal care, unspecified trimester: Secondary | ICD-10-CM | POA: Insufficient documentation

## 2016-12-01 LAB — PMP SCREEN PROFILE (10S), URINE
Amphetamine Scrn, Ur: NEGATIVE ng/mL
BARBITURATE SCREEN URINE: NEGATIVE ng/mL
BENZODIAZEPINE SCREEN, URINE: NEGATIVE ng/mL
CANNABINOIDS UR QL SCN: NEGATIVE ng/mL
Cocaine (Metab) Scrn, Ur: NEGATIVE ng/mL
Creatinine(Crt), U: 216.7 mg/dL (ref 20.0–300.0)
METHADONE SCREEN, URINE: NEGATIVE ng/mL
OXYCODONE+OXYMORPHONE UR QL SCN: POSITIVE ng/mL — AB
Opiate Scrn, Ur: NEGATIVE ng/mL
Ph of Urine: 5.6 (ref 4.5–8.9)
Phencyclidine Qn, Ur: NEGATIVE ng/mL
Propoxyphene Scrn, Ur: NEGATIVE ng/mL

## 2016-12-02 LAB — URINE CULTURE

## 2016-12-03 LAB — CYTOLOGY - PAP
Chlamydia: NEGATIVE
DIAGNOSIS: NEGATIVE
NEISSERIA GONORRHEA: NEGATIVE

## 2016-12-06 IMAGING — US US OB COMP +14 WK
1 series · 12 of 28 positions shown · non-contrast
Comparison: none

[Series 1: us ob comp +14 wk mfm · 67 acquisitions, 12 frames shown]
[im 3/67]
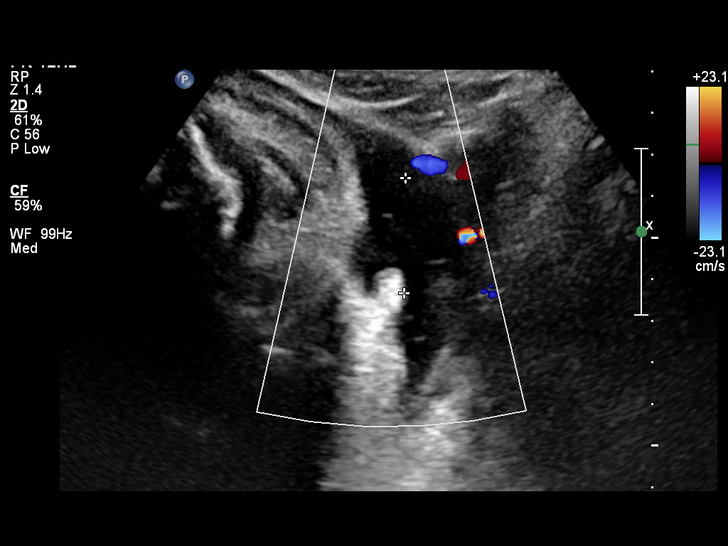
[im 8/67]
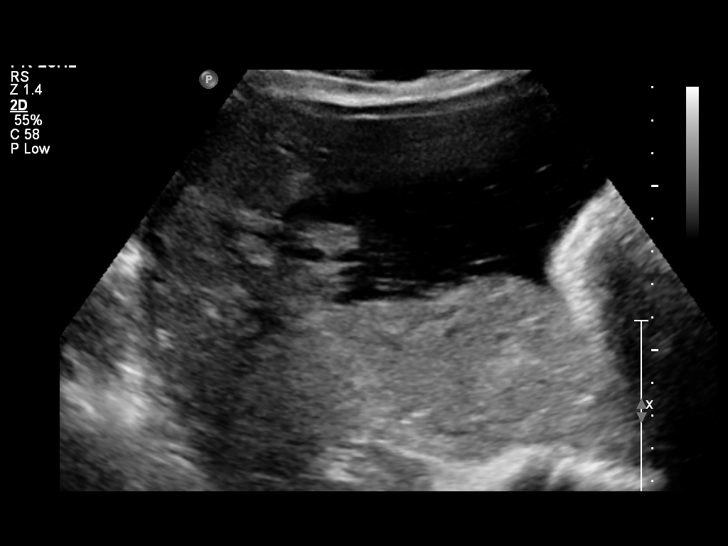
[im 13/67]
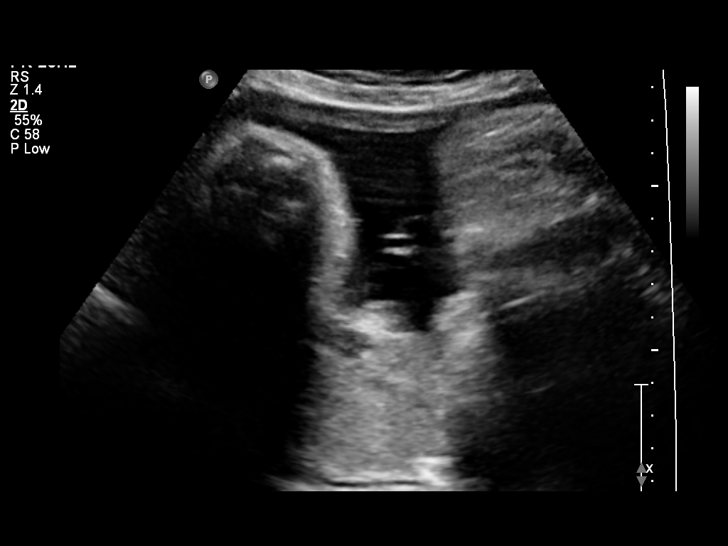
[im 20/67]
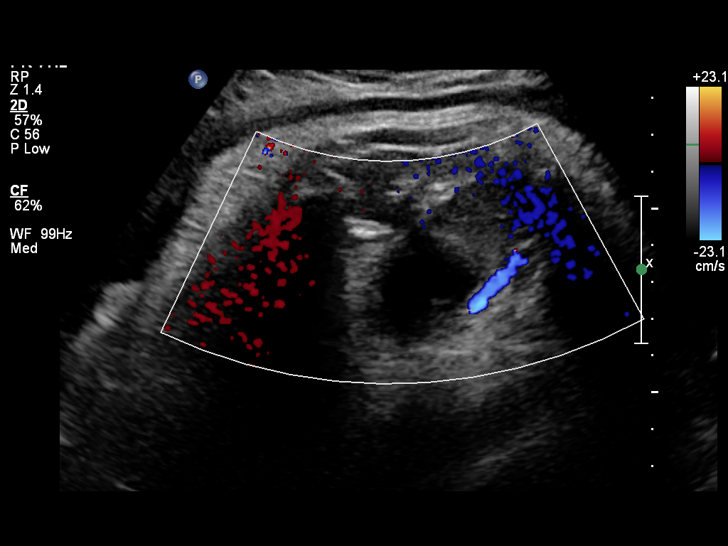
[im 25/67]
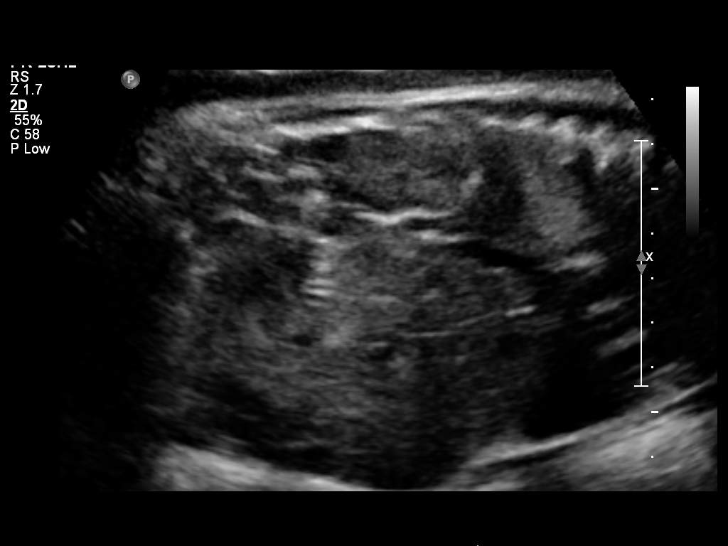
[im 30/67]
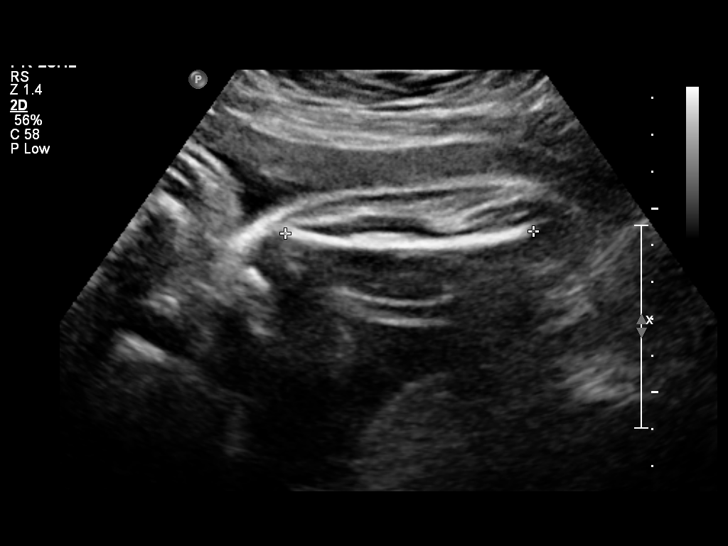
[im 37/67]
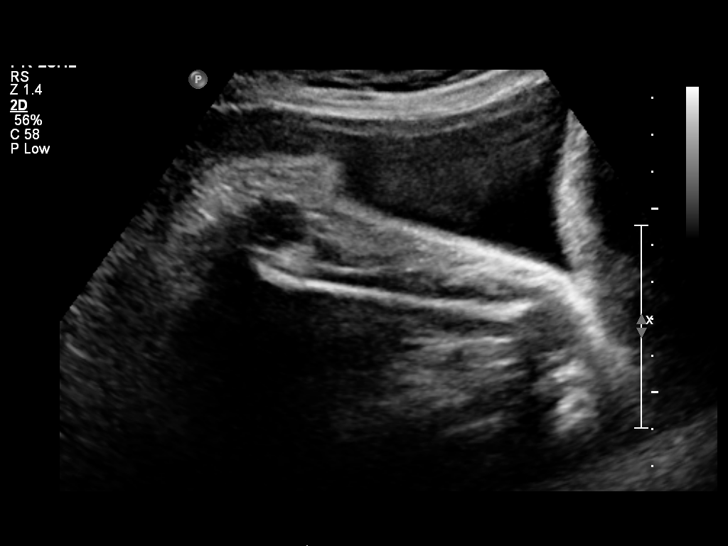
[im 42/67]
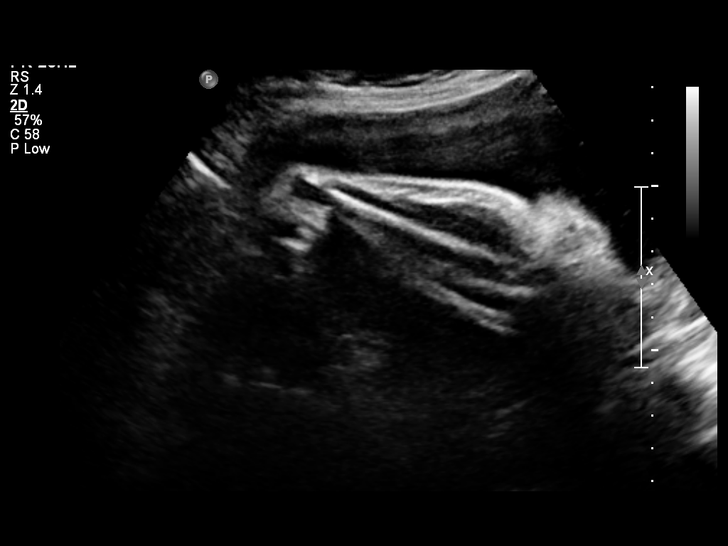
[im 47/67]
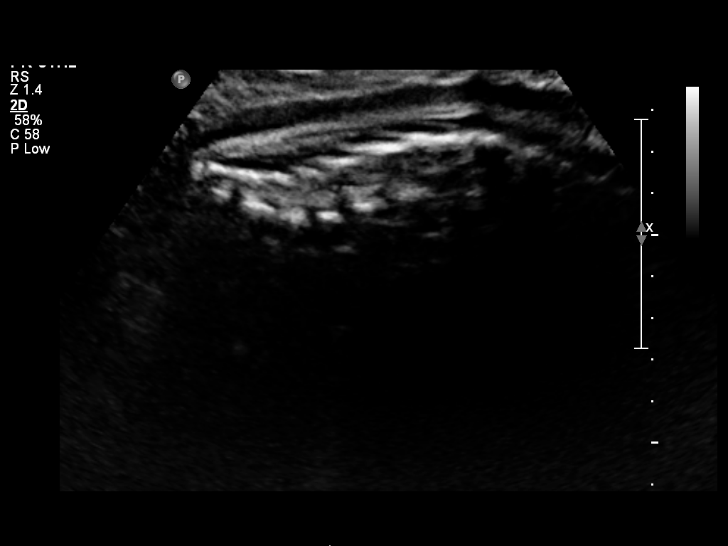
[im 54/67]
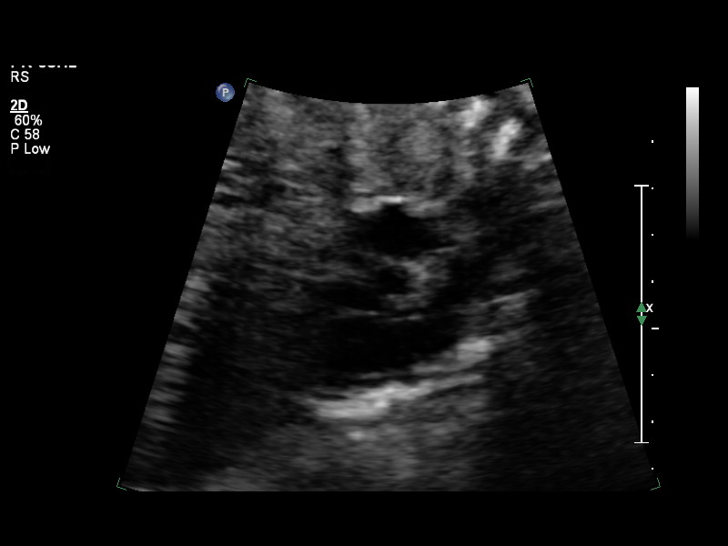
[im 59/67]
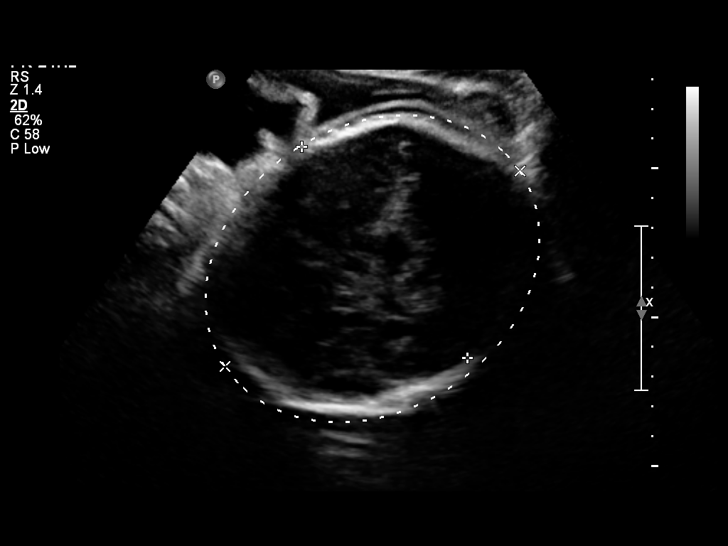
[im 64/67]
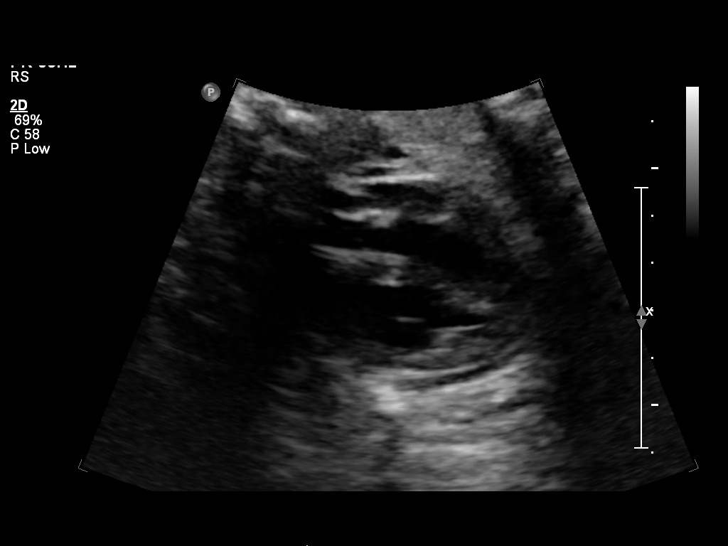

[12 of 28 positions shown; findings below may reference images not displayed]

OBSTETRICS REPORT
                      (Signed Final 03/22/2014 [DATE])

Service(s) Provided

 US OB COMP + 14 WK                                    76805.1
Indications

 40 weeks gestation of pregnancy
 Postdate pregnancy (40-42 weeks)
 No or Little Prenatal Care
 Previous cesarean section
Fetal Evaluation

 Num Of Fetuses:    1
 Fetal Heart Rate:  150                          bpm
 Cardiac Activity:  Observed
 Presentation:      Cephalic
 Placenta:          Posterior, above cervical
                    os
 P. Cord            Visualized
 Insertion:

 Amniotic Fluid
 AFI FV:      Subjectively within normal limits
 AFI Sum:     14.87   cm       65  %Tile     Larg Pckt:    5.49  cm
 RUQ:   2.96    cm   RLQ:    3.66   cm    LUQ:   5.49    cm   LLQ:    2.76   cm
Biometry

 BPD:     91.3  mm     G. Age:  37w 0d                CI:        75.06   70 - 86
                                                      FL/HC:      22.5   20.7 -

 HC:     334.3  mm     G. Age:  38w 2d                HC/AC:      0.94   0.87 -

 AC:     356.2  mm     G. Age:  39w 4d                FL/BPD:     82.4   71 - 87
 FL:      75.2  mm     G. Age:  38w 4d                FL/AC:      21.1   20 - 24
 HUM:     67.9  mm     G. Age:  39w 4d

 Est. FW:    0601  gm    7 lb 15 oz      73  %
Gestational Age

 LMP:           41w 5d        Date:  06/02/13                 EDD:   03/09/14
 U/S Today:     38w 3d                                        EDD:   04/01/14
 Best:          40w 3d     Det. By:  Early Exam  (08/06/13)   EDD:   03/18/14
Anatomy

 Cranium:          Appears normal         Aortic Arch:      Appears normal
 Fetal Cavum:      Not well visualized    Ductal Arch:      Not well visualized
 Ventricles:       Not well visualized    Diaphragm:        Not well visualized
 Choroid Plexus:   Not well visualized    Stomach:          Appears normal, left
                                                            sided
 Cerebellum:       Not well visualized    Abdomen:          Appears normal
 Posterior Fossa:  Not well visualized    Abdominal Wall:   Not well visualized
 Nuchal Fold:      Not well visualized    Cord Vessels:     Appears normal (3
                                                            vessel cord)
 Face:             Not well visualized    Kidneys:          Appear normal
 Lips:             Not well visualized    Bladder:          Appears normal
 Heart:            Not well visualized    Spine:            Ltd views no
                                                            intracranial signs of
                                                            NTD
 RVOT:             Appears normal         Lower             Visualized
                                          Extremities:
 LVOT:             Appears normal         Upper             Visualized
                                          Extremities:

 Other:  Technically difficult due to advanced GA and fetal position.
Cervix Uterus Adnexa

 Cervix:       Not visualized (advanced GA >87wks)
 Uterus:       No abnormality visualized.
 Cul De Sac:   No free fluid seen.

 Left Ovary:    Not visualized.
 Right Ovary:   Not visualized.
 Adnexa:     No abnormality visualized.
Impression

 Single IUP at 40w 3d
 No prenatal care
 Limited views of the fetal anatomy obtained due to late
 gestational age
 No gross anomalies noted
 The estimated fetal weight today is at the 73rd %tile
 Normal amniotic fluid volume
Recommendations

 Follow-up ultrasounds as clinically indicated.

 questions or concerns.

## 2016-12-08 ENCOUNTER — Ambulatory Visit (INDEPENDENT_AMBULATORY_CARE_PROVIDER_SITE_OTHER): Payer: Medicaid Other

## 2016-12-08 DIAGNOSIS — Z363 Encounter for antenatal screening for malformations: Secondary | ICD-10-CM | POA: Diagnosis not present

## 2016-12-08 DIAGNOSIS — O0930 Supervision of pregnancy with insufficient antenatal care, unspecified trimester: Secondary | ICD-10-CM

## 2016-12-08 DIAGNOSIS — Z3402 Encounter for supervision of normal first pregnancy, second trimester: Secondary | ICD-10-CM

## 2016-12-08 NOTE — Progress Notes (Signed)
US 24+4 wks,breech,ant pl gr 0,normal ovaries bilat,cx 3.3 cm,svp of fluid 4.9 cm,fhr 142 bpm,bilat pyelectasis RK 5.7 mm,LK 5 mm,EFW 728 g 49%,anatomy complete

## 2016-12-29 ENCOUNTER — Encounter: Payer: Medicaid Other | Admitting: Women's Health

## 2016-12-29 ENCOUNTER — Other Ambulatory Visit: Payer: Medicaid Other

## 2016-12-29 DIAGNOSIS — Z3A27 27 weeks gestation of pregnancy: Secondary | ICD-10-CM

## 2016-12-29 DIAGNOSIS — Z131 Encounter for screening for diabetes mellitus: Secondary | ICD-10-CM

## 2016-12-29 DIAGNOSIS — Z3482 Encounter for supervision of other normal pregnancy, second trimester: Secondary | ICD-10-CM

## 2017-02-07 ENCOUNTER — Other Ambulatory Visit: Payer: Medicaid Other

## 2017-02-07 ENCOUNTER — Ambulatory Visit (INDEPENDENT_AMBULATORY_CARE_PROVIDER_SITE_OTHER): Payer: Medicaid Other | Admitting: Obstetrics & Gynecology

## 2017-02-07 ENCOUNTER — Encounter: Payer: Self-pay | Admitting: Obstetrics & Gynecology

## 2017-02-07 VITALS — BP 118/80 | HR 79 | Wt 191.0 lb

## 2017-02-07 DIAGNOSIS — O35EXX0 Maternal care for other (suspected) fetal abnormality and damage, fetal genitourinary anomalies, not applicable or unspecified: Secondary | ICD-10-CM

## 2017-02-07 DIAGNOSIS — Z331 Pregnant state, incidental: Secondary | ICD-10-CM

## 2017-02-07 DIAGNOSIS — O358XX0 Maternal care for other (suspected) fetal abnormality and damage, not applicable or unspecified: Secondary | ICD-10-CM | POA: Diagnosis not present

## 2017-02-07 DIAGNOSIS — Z3482 Encounter for supervision of other normal pregnancy, second trimester: Secondary | ICD-10-CM

## 2017-02-07 DIAGNOSIS — Z3A33 33 weeks gestation of pregnancy: Secondary | ICD-10-CM | POA: Diagnosis not present

## 2017-02-07 DIAGNOSIS — O99320 Drug use complicating pregnancy, unspecified trimester: Secondary | ICD-10-CM

## 2017-02-07 DIAGNOSIS — N1330 Unspecified hydronephrosis: Secondary | ICD-10-CM

## 2017-02-07 DIAGNOSIS — Z91199 Patient's noncompliance with other medical treatment and regimen due to unspecified reason: Secondary | ICD-10-CM

## 2017-02-07 DIAGNOSIS — F1120 Opioid dependence, uncomplicated: Secondary | ICD-10-CM

## 2017-02-07 DIAGNOSIS — Z1389 Encounter for screening for other disorder: Secondary | ICD-10-CM | POA: Diagnosis not present

## 2017-02-07 DIAGNOSIS — O99323 Drug use complicating pregnancy, third trimester: Secondary | ICD-10-CM | POA: Diagnosis not present

## 2017-02-07 DIAGNOSIS — Z9119 Patient's noncompliance with other medical treatment and regimen: Secondary | ICD-10-CM

## 2017-02-07 DIAGNOSIS — Z3483 Encounter for supervision of other normal pregnancy, third trimester: Secondary | ICD-10-CM

## 2017-02-07 DIAGNOSIS — Z131 Encounter for screening for diabetes mellitus: Secondary | ICD-10-CM

## 2017-02-07 LAB — POCT URINALYSIS DIPSTICK
Blood, UA: NEGATIVE
GLUCOSE UA: NEGATIVE
Ketones, UA: NEGATIVE
LEUKOCYTES UA: NEGATIVE
Nitrite, UA: NEGATIVE
Protein, UA: NEGATIVE

## 2017-02-07 NOTE — Progress Notes (Signed)
[id number] 9465w2d Estimated Date of Delivery: 03/26/17  Blood pressure 118/80, pulse 79, weight 191 lb (86.6 kg), last menstrual period 06/19/2016, not currently breastfeeding.   BP weight and urine results all reviewed and noted.  Please refer to the obstetrical flow sheet for the fundal height and fetal heart rate documentation:  Patient reports good fetal movement, denies any bleeding and no rupture of membranes symptoms or regular contractions. Patient is without complaints. All questions were answered.  Orders Placed This Encounter  Procedures  . US OB Limited  . POCT urinalysis dipstick    Plan:  Continued routine obstetrical care,  As she always does the patient threw up her Glucola today we will try again next week Additionally she is taking her Subutex but only 8 mg in the morning and 4 mg in the evening she is not requiring 8 mg 3 times a day Otherwise no problems we will see her back in a week to retry the Glucola and to repeat the ultrasound looking at renal pelvic dilatation  Return in about 1 week (around 02/14/2017) for Repeat Glucola, Repeat sonogram, LROB.

## 2017-02-08 LAB — ANTIBODY SCREEN: ANTIBODY SCREEN: NEGATIVE

## 2017-02-08 LAB — CBC
HEMATOCRIT: 34.7 % (ref 34.0–46.6)
Hemoglobin: 11.6 g/dL (ref 11.1–15.9)
MCH: 29.2 pg (ref 26.6–33.0)
MCHC: 33.4 g/dL (ref 31.5–35.7)
MCV: 87 fL (ref 79–97)
Platelets: 148 10*3/uL — ABNORMAL LOW (ref 150–379)
RBC: 3.97 x10E6/uL (ref 3.77–5.28)
RDW: 13.6 % (ref 12.3–15.4)
WBC: 7.1 10*3/uL (ref 3.4–10.8)

## 2017-02-08 LAB — RPR: RPR Ser Ql: NONREACTIVE

## 2017-02-08 LAB — HIV ANTIBODY (ROUTINE TESTING W REFLEX): HIV SCREEN 4TH GENERATION: NONREACTIVE

## 2017-02-16 ENCOUNTER — Ambulatory Visit (INDEPENDENT_AMBULATORY_CARE_PROVIDER_SITE_OTHER): Payer: Medicaid Other | Admitting: Advanced Practice Midwife

## 2017-02-16 ENCOUNTER — Encounter: Payer: Self-pay | Admitting: Advanced Practice Midwife

## 2017-02-16 ENCOUNTER — Other Ambulatory Visit: Payer: Medicaid Other

## 2017-02-16 ENCOUNTER — Ambulatory Visit (INDEPENDENT_AMBULATORY_CARE_PROVIDER_SITE_OTHER): Payer: Medicaid Other

## 2017-02-16 VITALS — BP 110/82 | HR 73 | Wt 191.0 lb

## 2017-02-16 DIAGNOSIS — O358XX0 Maternal care for other (suspected) fetal abnormality and damage, not applicable or unspecified: Secondary | ICD-10-CM

## 2017-02-16 DIAGNOSIS — Z331 Pregnant state, incidental: Secondary | ICD-10-CM

## 2017-02-16 DIAGNOSIS — Z8759 Personal history of other complications of pregnancy, childbirth and the puerperium: Secondary | ICD-10-CM | POA: Diagnosis not present

## 2017-02-16 DIAGNOSIS — O35EXX0 Maternal care for other (suspected) fetal abnormality and damage, fetal genitourinary anomalies, not applicable or unspecified: Secondary | ICD-10-CM

## 2017-02-16 DIAGNOSIS — Z3483 Encounter for supervision of other normal pregnancy, third trimester: Secondary | ICD-10-CM | POA: Diagnosis not present

## 2017-02-16 DIAGNOSIS — Z3A34 34 weeks gestation of pregnancy: Secondary | ICD-10-CM | POA: Diagnosis not present

## 2017-02-16 DIAGNOSIS — Z3403 Encounter for supervision of normal first pregnancy, third trimester: Secondary | ICD-10-CM

## 2017-02-16 DIAGNOSIS — Z1389 Encounter for screening for other disorder: Secondary | ICD-10-CM | POA: Diagnosis not present

## 2017-02-16 DIAGNOSIS — O0930 Supervision of pregnancy with insufficient antenatal care, unspecified trimester: Secondary | ICD-10-CM

## 2017-02-16 DIAGNOSIS — Z131 Encounter for screening for diabetes mellitus: Secondary | ICD-10-CM

## 2017-02-16 DIAGNOSIS — Z79891 Long term (current) use of opiate analgesic: Secondary | ICD-10-CM

## 2017-02-16 LAB — POCT URINALYSIS DIPSTICK
Blood, UA: NEGATIVE
Glucose, UA: NEGATIVE
KETONES UA: NEGATIVE
Nitrite, UA: NEGATIVE
PROTEIN UA: NEGATIVE

## 2017-02-16 NOTE — Patient Instructions (Signed)

## 2017-02-16 NOTE — Progress Notes (Signed)
US 34+4 wks,cephalic,ant pl gr 1,afi 15 cm,bilat pyelectasis, LK 5.9 mm,RK 5.7 mm,fhr 120 bpm,EFW 2516 g 49%

## 2017-02-16 NOTE — Progress Notes (Signed)
[id number] 1749w4d Estimated Date of Delivery: 03/26/17  Blood pressure 110/82, pulse 73, weight 191 lb (86.6 kg), last menstrual period 06/19/2016, not currently breastfeeding.   BP weight and urine results all reviewed and noted.  Please refer to the obstetrical flow sheet for the fundal height and fetal heart rate documentation:  Taking Subutex 8mg  am and 4mg  pm.  US 34+4 wks,cephalic,ant pl gr 1,afi 15 cm,bilat pyelectasis, LK 5.9 mm,RK 5.7 mm,fhr 120 bpm,EFW 2516 g 49%  Normal renal pelvis (<757mm > 30 weeks)  Patient reports good fetal movement, denies any bleeding and no rupture of membranes symptoms or regular contractions. Patient is without complaints. VM left for NAS consult.  All questions were answered.  Orders Placed This Encounter  Procedures  . POCT urinalysis dipstick    Plan:  Continued routine obstetrical care, PN2 toady (vomited before)  Return in about 2 weeks (around 03/02/2017) for LROB.

## 2017-02-18 ENCOUNTER — Encounter (HOSPITAL_COMMUNITY): Payer: Self-pay

## 2017-02-18 ENCOUNTER — Encounter: Payer: Self-pay | Admitting: Obstetrics and Gynecology

## 2017-02-18 DIAGNOSIS — O35EXX0 Maternal care for other (suspected) fetal abnormality and damage, fetal genitourinary anomalies, not applicable or unspecified: Secondary | ICD-10-CM | POA: Insufficient documentation

## 2017-02-18 DIAGNOSIS — O358XX0 Maternal care for other (suspected) fetal abnormality and damage, not applicable or unspecified: Secondary | ICD-10-CM | POA: Insufficient documentation

## 2017-02-18 LAB — GLUCOSE TOLERANCE, 2 HOURS W/ 1HR
GLUCOSE, 1 HOUR: 117 mg/dL (ref 65–179)
GLUCOSE, 2 HOUR: 116 mg/dL (ref 65–152)
Glucose, Fasting: 99 mg/dL — ABNORMAL HIGH (ref 65–91)

## 2017-02-18 NOTE — Progress Notes (Signed)
NICU NAS Tour scheduled on March 02, 2017 at 10:00.

## 2017-03-02 ENCOUNTER — Ambulatory Visit (INDEPENDENT_AMBULATORY_CARE_PROVIDER_SITE_OTHER): Payer: Medicaid Other | Admitting: Advanced Practice Midwife

## 2017-03-02 VITALS — BP 114/78 | HR 76 | Wt 190.5 lb

## 2017-03-02 DIAGNOSIS — O34219 Maternal care for unspecified type scar from previous cesarean delivery: Secondary | ICD-10-CM | POA: Diagnosis not present

## 2017-03-02 DIAGNOSIS — Z3483 Encounter for supervision of other normal pregnancy, third trimester: Secondary | ICD-10-CM

## 2017-03-02 DIAGNOSIS — Z3A36 36 weeks gestation of pregnancy: Secondary | ICD-10-CM

## 2017-03-02 DIAGNOSIS — Z331 Pregnant state, incidental: Secondary | ICD-10-CM

## 2017-03-02 DIAGNOSIS — Z8759 Personal history of other complications of pregnancy, childbirth and the puerperium: Secondary | ICD-10-CM | POA: Diagnosis not present

## 2017-03-02 DIAGNOSIS — Z1389 Encounter for screening for other disorder: Secondary | ICD-10-CM

## 2017-03-02 DIAGNOSIS — Z79891 Long term (current) use of opiate analgesic: Secondary | ICD-10-CM | POA: Diagnosis not present

## 2017-03-02 LAB — POCT URINALYSIS DIPSTICK
Blood, UA: NEGATIVE
GLUCOSE UA: NEGATIVE
Ketones, UA: NEGATIVE
LEUKOCYTES UA: NEGATIVE
NITRITE UA: NEGATIVE
Protein, UA: NEGATIVE

## 2017-03-02 LAB — OB RESULTS CONSOLE GBS: GBS: NEGATIVE

## 2017-03-02 NOTE — Patient Instructions (Signed)

## 2017-03-02 NOTE — Progress Notes (Signed)
LOW-RISK PREGNANCY VISIT Patient name: [name] MRN [id number]  Date of birth: 04/13/1990 Chief Complaint:   Routine Prenatal Visit  History of Present Illness:   [name] is a 26 y.o. [id number] female at 9556w4d with an Estimated Date of Delivery: 03/26/17 being seen today for ongoing management of a low-risk pregnancy.  Today she reports no complaints. Contractions: Irregular. Vag. Bleeding: None.  Movement: Present. denies leaking of fluid. Review of Systems:   Pertinent items are noted in HPI Denies abnormal vaginal discharge w/ itching/odor/irritation, headaches, visual changes, shortness of breath, chest pain, abdominal pain, severe nausea/vomiting, or problems with urination or bowel movements unless otherwise stated above.  Pertinent History Reviewed:  Medical & Surgical Hx:   Past Medical History:  Diagnosis Date  . Gestational diabetes   . Panic attacks 2013   Was given Xanax; last had panic attack 2.5 yrs ago.  Marland Kitchen. Scoliosis   . Supervision of normal pregnancy 09/11/2013   Past Surgical History:  Procedure Laterality Date  . CESAREAN SECTION  2011   breech; third baby  . urethra stretched     Family History  Problem Relation Age of Onset  . Hypertension Mother   . Lupus Mother   . Mental illness Mother   . Bipolar disorder Mother   . ADD / ADHD Mother   . Bipolar disorder Brother   . ADD / ADHD Brother   . Heart attack Maternal Grandmother   . Heart disease Maternal Grandmother   . Hypertension Maternal Grandmother   . Cancer Maternal Grandfather   . Hypertension Paternal Grandmother   . Thyroid disease Maternal Aunt   . Heart disease Maternal Aunt   . Lupus Maternal Aunt   . Thyroid disease Maternal Uncle   . Heart attack Maternal Uncle   . Heart disease Maternal Uncle   . Lupus Maternal Uncle     Current Outpatient Medications:  .  acetaminophen (TYLENOL) 325 MG tablet, Take 2 tablets (650 mg total) by mouth every 4 (four) hours as needed (for pain  scale < 4)., Disp: 50 tablet, Rfl: 0 .  buprenorphine (SUBUTEX) 8 MG SUBL SL tablet, DISSOLVE ONE TABLET UNDER THE TONGUE EVERY 8 HOURS, Disp: , Rfl: 0 .  Prenatal Vit-Fe Fumarate-FA (PRENATAL MULTIVITAMIN) TABS tablet, Take 1 tablet by mouth daily at 12 noon. Reported on 09/04/2015, Disp: , Rfl:  Social History: Reviewed -  reports that  has never smoked. she has never used smokeless tobacco.  Physical Assessment:   Vitals:   03/02/17 1150  BP: 114/78  Pulse: 76  Weight: 190 lb 8 oz (86.4 kg)  Body mass index is 31.7 kg/m.        Physical Examination:   General appearance: Well appearing, and in no distress  Mental status: Alert, oriented to person, place, and time  Skin: Warm & dry  Cardiovascular: Normal heart rate noted  Respiratory: Normal respiratory effort, no distress  Abdomen: Soft, gravid, nontender  Pelvic: Cervical exam performed         Extremities: Edema: None  Fetal Status:     Movement: Present    Results for orders placed or performed in visit on 03/02/17 (from the past 24 hour(s))  POCT Urinalysis Dipstick   Collection Time: 03/02/17 11:56 AM  Result Value Ref Range   Color, UA     Clarity, UA     Glucose, UA neg    Bilirubin, UA     Ketones, UA neg  Spec Grav, UA  1.010 - 1.025   Blood, UA neg    pH, UA  5.0 - 8.0   Protein, UA neg    Urobilinogen, UA  0.2 or 1.0 E.U./dL   Nitrite, UA neg    Leukocytes, UA Negative Negative    Assessment & Plan:  1) Low-risk pregnancy [id number] at 6050w4d with an Estimated Date of Delivery: 03/26/17   2) Subutex; Had NAS tour this morning. Totally forgot she had to place her 28 yo in a group home (voluntarily) dt violence/school suspension/pot.  Other kids were going to be taken if she didn't  Wants to reschedule.     Labs/procedures/US today: GBS/GC/CHL  Plan:  Continue routine obstetrical care   email sent to reschedule NAS consult  Follow-up: Return in about 1 week (around 03/09/2017) for LROB.  Orders Placed  This Encounter  Procedures  . GC/Chlamydia Probe Amp  . Culture, beta strep (group b only)  . Pain Management Screening Profile (10S)  . POCT Urinalysis Dipstick   CRESENZO-DISHMAN,Tramar Brueckner CNM 03/02/2017 12:34 PM

## 2017-03-03 LAB — PMP SCREEN PROFILE (10S), URINE
AMPHETAMINE SCREEN URINE: NEGATIVE ng/mL
BARBITURATE SCREEN URINE: NEGATIVE ng/mL
BENZODIAZEPINE SCREEN, URINE: NEGATIVE ng/mL
CANNABINOIDS UR QL SCN: NEGATIVE ng/mL
CREATININE(CRT), U: 104.5 mg/dL (ref 20.0–300.0)
Cocaine (Metab) Scrn, Ur: NEGATIVE ng/mL
METHADONE SCREEN, URINE: NEGATIVE ng/mL
OPIATE SCREEN URINE: NEGATIVE ng/mL
OXYCODONE+OXYMORPHONE UR QL SCN: NEGATIVE ng/mL
PHENCYCLIDINE QUANTITATIVE URINE: NEGATIVE ng/mL
PROPOXYPHENE SCREEN URINE: NEGATIVE ng/mL
Ph of Urine: 6.4 (ref 4.5–8.9)

## 2017-03-04 LAB — GC/CHLAMYDIA PROBE AMP
Chlamydia trachomatis, NAA: NEGATIVE
NEISSERIA GONORRHOEAE BY PCR: NEGATIVE

## 2017-03-06 LAB — CULTURE, BETA STREP (GROUP B ONLY): Strep Gp B Culture: NEGATIVE

## 2017-03-09 ENCOUNTER — Ambulatory Visit (INDEPENDENT_AMBULATORY_CARE_PROVIDER_SITE_OTHER): Payer: Medicaid Other | Admitting: Advanced Practice Midwife

## 2017-03-09 VITALS — BP 118/68 | HR 80 | Wt 190.0 lb

## 2017-03-09 DIAGNOSIS — Z331 Pregnant state, incidental: Secondary | ICD-10-CM

## 2017-03-09 DIAGNOSIS — Z1389 Encounter for screening for other disorder: Secondary | ICD-10-CM | POA: Diagnosis not present

## 2017-03-09 DIAGNOSIS — F1120 Opioid dependence, uncomplicated: Secondary | ICD-10-CM

## 2017-03-09 DIAGNOSIS — Z79891 Long term (current) use of opiate analgesic: Secondary | ICD-10-CM

## 2017-03-09 DIAGNOSIS — O34219 Maternal care for unspecified type scar from previous cesarean delivery: Secondary | ICD-10-CM | POA: Diagnosis not present

## 2017-03-09 DIAGNOSIS — O99323 Drug use complicating pregnancy, third trimester: Secondary | ICD-10-CM | POA: Diagnosis not present

## 2017-03-09 DIAGNOSIS — Z3483 Encounter for supervision of other normal pregnancy, third trimester: Secondary | ICD-10-CM

## 2017-03-09 LAB — POCT URINALYSIS DIPSTICK
Glucose, UA: NEGATIVE
PROTEIN UA: NEGATIVE

## 2017-03-09 NOTE — Patient Instructions (Signed)

## 2017-03-09 NOTE — Progress Notes (Signed)
LOW-RISK PREGNANCY VISIT Patient name: [name] MRN [id number]  Date of birth: 04/07/1990 Chief Complaint:   Routine Prenatal Visit  History of Present Illness:   [name] is a 26 y.o. [id number] female at 7622w4d with an Estimated Date of Delivery: 03/26/17 being seen today for ongoing management of a low-risk pregnancy.  Today she reports no complaints. Contractions: Irregular. Vag. Bleeding: None.  Movement: Present. denies leaking of fluid. Review of Systems:   Pertinent items are noted in HPI Denies abnormal vaginal discharge w/ itching/odor/irritation, headaches, visual changes, shortness of breath, chest pain, abdominal pain, severe nausea/vomiting, or problems with urination or bowel movements unless otherwise stated above.  Pertinent History Reviewed:  Medical & Surgical Hx:   Past Medical History:  Diagnosis Date  . Gestational diabetes   . Panic attacks 2013   Was given Xanax; last had panic attack 2.5 yrs ago.  Marland Kitchen. Scoliosis   . Supervision of normal pregnancy 09/11/2013   Past Surgical History:  Procedure Laterality Date  . CESAREAN SECTION  2011   breech; third baby  . urethra stretched     Family History  Problem Relation Age of Onset  . Hypertension Mother   . Lupus Mother   . Mental illness Mother   . Bipolar disorder Mother   . ADD / ADHD Mother   . Bipolar disorder Brother   . ADD / ADHD Brother   . Heart attack Maternal Grandmother   . Heart disease Maternal Grandmother   . Hypertension Maternal Grandmother   . Cancer Maternal Grandfather   . Hypertension Paternal Grandmother   . Thyroid disease Maternal Aunt   . Heart disease Maternal Aunt   . Lupus Maternal Aunt   . Thyroid disease Maternal Uncle   . Heart attack Maternal Uncle   . Heart disease Maternal Uncle   . Lupus Maternal Uncle     Current Outpatient Medications:  .  acetaminophen (TYLENOL) 325 MG tablet, Take 2 tablets (650 mg total) by mouth every 4 (four) hours as needed (for pain  scale < 4)., Disp: 50 tablet, Rfl: 0 .  buprenorphine (SUBUTEX) 8 MG SUBL SL tablet, DISSOLVE ONE TABLET UNDER THE TONGUE EVERY 8 HOURS, Disp: , Rfl: 0 .  Prenatal Vit-Fe Fumarate-FA (PRENATAL MULTIVITAMIN) TABS tablet, Take 1 tablet by mouth daily at 12 noon. Reported on 09/04/2015, Disp: , Rfl:  Social History: Reviewed -  reports that  has never smoked. she has never used smokeless tobacco.  Physical Assessment:   Vitals:   03/09/17 1140  BP: 118/68  Pulse: 80  Weight: 190 lb (86.2 kg)  Body mass index is 31.62 kg/m.        Physical Examination:   General appearance: Well appearing, and in no distress  Mental status: Alert, oriented to person, place, and time  Skin: Warm & dry  Cardiovascular: Normal heart rate noted  Respiratory: Normal respiratory effort, no distress  Abdomen: Soft, gravid, nontender  Pelvic: Cervical exam performed  Dilation: 2 Effacement (%): 60 Station: -2  Extremities: Edema: None  Fetal Status: Fetal Heart Rate (bpm): 141 Fundal Height: 36 cm Movement: Present Presentation: Vertex  No results found for this or any previous visit (from the past 24 hour(s)).  Assessment & Plan:  1) Low-risk pregnancy [id number] at 422w4d with an Estimated Date of Delivery: 03/26/17   2) plans , TOLAC consent signed.     Labs/procedures/US today: none  Plan:  Continue routine obstetrical care    Follow-up: Return  in about 1 week (around 03/16/2017) for LROB.  Orders Placed This Encounter  Procedures  . POCT Urinalysis Dipstick   CRESENZO-DISHMAN,Lamel Mccarley CNM 03/09/2017 12:15 PM

## 2017-03-10 ENCOUNTER — Encounter (HOSPITAL_COMMUNITY): Payer: Self-pay

## 2017-03-10 NOTE — Progress Notes (Signed)
Patient missed NICU NAS tour scheduled on 03/02/17. OB requested patient be contacted to reschedule tour. Message left on 03/07/17 with an offer for a tour on 03/09/17 at 1:30. No response from patient to message.  Message left again today with a request for patient to call back if interested in rescheduling.  Contact number for scheduling NICU NAS tour is [phone].

## 2017-03-16 ENCOUNTER — Ambulatory Visit (INDEPENDENT_AMBULATORY_CARE_PROVIDER_SITE_OTHER): Payer: Medicaid Other | Admitting: Advanced Practice Midwife

## 2017-03-16 ENCOUNTER — Encounter: Payer: Self-pay | Admitting: Advanced Practice Midwife

## 2017-03-16 ENCOUNTER — Ambulatory Visit (INDEPENDENT_AMBULATORY_CARE_PROVIDER_SITE_OTHER): Payer: Medicaid Other

## 2017-03-16 VITALS — BP 120/88 | HR 73 | Wt 191.0 lb

## 2017-03-16 DIAGNOSIS — O24410 Gestational diabetes mellitus in pregnancy, diet controlled: Secondary | ICD-10-CM

## 2017-03-16 DIAGNOSIS — Z3A38 38 weeks gestation of pregnancy: Secondary | ICD-10-CM

## 2017-03-16 DIAGNOSIS — O24419 Gestational diabetes mellitus in pregnancy, unspecified control: Secondary | ICD-10-CM | POA: Insufficient documentation

## 2017-03-16 DIAGNOSIS — F1120 Opioid dependence, uncomplicated: Secondary | ICD-10-CM | POA: Diagnosis not present

## 2017-03-16 DIAGNOSIS — O0993 Supervision of high risk pregnancy, unspecified, third trimester: Secondary | ICD-10-CM

## 2017-03-16 DIAGNOSIS — O99320 Drug use complicating pregnancy, unspecified trimester: Secondary | ICD-10-CM | POA: Diagnosis not present

## 2017-03-16 DIAGNOSIS — Z1389 Encounter for screening for other disorder: Secondary | ICD-10-CM

## 2017-03-16 DIAGNOSIS — Z331 Pregnant state, incidental: Secondary | ICD-10-CM | POA: Diagnosis not present

## 2017-03-16 LAB — POCT URINALYSIS DIPSTICK
Blood, UA: NEGATIVE
Glucose, UA: NEGATIVE
Ketones, UA: NEGATIVE
NITRITE UA: NEGATIVE
PROTEIN UA: NEGATIVE

## 2017-03-16 NOTE — Patient Instructions (Addendum)
Gestational Diabetes Mellitus, Diagnosis Gestational diabetes (gestational diabetes mellitus) is a temporary form of diabetes that some women develop during pregnancy. It usually occurs around weeks 24-28 of pregnancy and goes away after delivery. Hormonal changes during pregnancy can interfere with insulin production and function, which may result in one or both of these problems:  The pancreas does not make enough of a hormone called insulin.  Cells in the body do not respond properly to insulin that the body makes (insulin resistance).  Normally, insulin allows sugars (glucose) to enter cells in the body. The cells use glucose for energy. Insulin resistance or lack of insulin causes excess glucose to build up in the blood instead of going into cells. As a result, high blood glucose (hyperglycemia) develops. What are the risks? If gestational diabetes is treated, it is unlikely to cause problems. If it is not controlled with treatment, it may cause problems during labor and delivery, and some of those problems can be harmful to the unborn baby (fetus) and the mother. Uncontrolled gestational diabetes may also cause the newborn baby to have breathing problems and low blood glucose. Women who get gestational diabetes are more likely to develop it if they get pregnant again, and they are more likely to develop type 2 diabetes in the future. What increases the risk? This condition may be more likely to develop in pregnant women who:  Are older than age 25 during pregnancy.  Have a family history of diabetes.  Are overweight.  Had gestational diabetes in the past.  Have polycystic ovarian syndrome (PCOS).  Are pregnant with twins or multiples.  Are of American-Indian, African-American, Hispanic/Latino, or Asian/Pacific Islander descent.  What are the signs or symptoms? Most women do not notice symptoms of gestational diabetes because the symptoms are similar to normal symptoms of pregnancy.  Symptoms of gestational diabetes may include:  Increased thirst (polydipsia).  Increased hunger(polyphagia).  Increased urination (polyuria).  How is this diagnosed?  This condition may be diagnosed based on your blood glucose level, which may be checked with one or more of the following blood tests:  A fasting blood glucose (FBG) test. You will not be allowed to eat (you will fast) for at least 8 hours before a blood sample is taken.  A random blood glucose test. This checks your blood glucose at any time of day regardless of when you ate.  An oral glucose tolerance test (OGTT). This is usually done during weeks 24-28 of pregnancy. ? For this test, you will have an FBG test done. Then, you will drink a beverage that contains glucose. Your blood glucose will be tested again 1 hour after drinking the glucose beverage (1-hour OGTT). ? If the 1-hour OGTT result is at or above 140 mg/dL (7.8 mmol/L), you will repeat the OGTT. This time, your blood glucose will be tested 3 hours after drinking the glucose beverage (3-hour OGTT).  If you have risk factors, you may be screened for undiagnosed type 2 diabetes at your first health care visit during your pregnancy (prenatal visit). How is this treated?  Your treatment may be managed by a specialist called an endocrinologist. This condition is treated by following instructions from your health care provider about:  Eating a healthier diet and getting more physical activity. These changes are the most important ways to manage gestational diabetes.  Checking your blood glucose. Do this as often as told.  Taking diabetes medicines or insulin every day. These will only be prescribed if they are   needed. ? If you use insulin, you may need to adjust your dosage based on how physically active you are and what foods you eat. Your health care provider will tell you how to do this.  Your health care provider will set treatment goals for you based on the  stage of your pregnancy and any other medical conditions you have. Generally, the goal of treatment is to maintain the following blood glucose levels during pregnancy:  Fasting: at or below 95 mg/dL (5.3 mmol/L).  After meals (postprandial): ? One hour after a meal: at or below 140 mg/dL (7.8 mmol/L). ? Two hours after a meal: at or below 120 mg/dL (6.7 mmol/L).  A1c (hemoglobin A1c) level: 6-6.5%.  Follow these instructions at home:  Take over-the-counter and prescription medicines only as told by your health care provider.  Manage your weight gain during pregnancy. The amount of weight that you are expected to gain depends on your pre-pregnancy BMI (body mass index).  Keep all follow-up visits as told by your health care provider. This is important. Consider asking your health care provider these questions:   Do I need to meet with a diabetes educator?  Where can I find a support group for people with diabetes?  What equipment will I need to manage my diabetes at home?  What diabetes medicines do I need, and when should I take them?  How often do I need to check my blood glucose?  What number can I call if I have questions?  When is my next appointment? Where to find more information:  For more information about diabetes, visit: ? American Diabetes Association (ADA): www.diabetes.org ? American Association of Diabetes Educators (AADE): www.diabeteseducator.org/patient-resources Contact a health care provider if:  Your blood glucose level is at or above 240 mg/dL (13.213.3 mmol/L).  Your blood glucose level is at or above 200 mg/dL (44.011.1 mmol/L) and you have ketones in your urine.  You have been sick or have had a fever for 2 days or more and you are not getting better.  You have any of the following problems for more than 6 hours: ? You cannot eat or drink. ? You have nausea and vomiting. ? You have diarrhea. Get help right away if:  Your blood glucose is below 54  mg/dL (3 mmol/L).  You become confused or you have trouble thinking clearly.  You have difficulty breathing.  You have moderate or large ketone levels in your urine.  Your baby is moving around less than usual.  You develop unusual discharge or bleeding from your vagina.  You start having contractions early (prematurely). Contractions may feel like a tightening in your lower abdomen. This information is not intended to replace advice given to you by your health care provider. Make sure you discuss any questions you have with your health care provider. Document Released: 06/28/2000 Document Revised: 08/28/2015 Document Reviewed: 04/25/2015 Elsevier Interactive Patient Education  2017 [employer]   Check Blood sugar first thing in the morning (fasting) and 2 hours after each meal. Goals Fasting <92 and 2 hrs after meals <120.   Write these down and bring to your next appointment. Diabetes Mellitus and Food It is important for you to manage your blood sugar (glucose) level. Your blood glucose level can be greatly affected by what you eat. Eating healthier foods in the appropriate amounts throughout the day at about the same time each day will help you control your blood glucose level. It can also help slow or  prevent worsening of your diabetes mellitus. Healthy eating may even help you improve the level of your blood pressure and reach or maintain a healthy weight. General recommendations for healthful eating and cooking habits include:  Eating meals and snacks regularly. Avoid going long periods of time without eating to lose weight.  Eating a diet that consists mainly of plant-based foods, such as fruits, vegetables, nuts, legumes, and whole grains.  Using low-heat cooking methods, such as baking, instead of high-heat cooking methods, such as deep frying.  Work with your dietitian to make sure you understand how to use the Nutrition Facts information on food labels. How can food  affect me? Carbohydrates Carbohydrates affect your blood glucose level more than any other type of food. Your dietitian will help you determine how many carbohydrates to eat at each meal and teach you how to count carbohydrates. Counting carbohydrates is important to keep your blood glucose at a healthy level, especially if you are using insulin or taking certain medicines for diabetes mellitus. Alcohol Alcohol can cause sudden decreases in blood glucose (hypoglycemia), especially if you use insulin or take certain medicines for diabetes mellitus. Hypoglycemia can be a life-threatening condition. Symptoms of hypoglycemia (sleepiness, dizziness, and disorientation) are similar to symptoms of having too much alcohol. If your health care provider has given you approval to drink alcohol, do so in moderation and use the following guidelines:  Women should not have more than one drink per day, and men should not have more than two drinks per day. One drink is equal to: ? 12 oz of beer. ? 5 oz of wine. ? 1 oz of hard liquor.  Do not drink on an empty stomach.  Keep yourself hydrated. Have water, diet soda, or unsweetened iced tea.  Regular soda, juice, and other mixers might contain a lot of carbohydrates and should be counted.  What foods are not recommended? As you make food choices, it is important to remember that all foods are not the same. Some foods have fewer nutrients per serving than other foods, even though they might have the same number of calories or carbohydrates. It is difficult to get your body what it needs when you eat foods with fewer nutrients. Examples of foods that you should avoid that are high in calories and carbohydrates but low in nutrients include:  Trans fats (most processed foods list trans fats on the Nutrition Facts label).  Regular soda.  Juice.  Candy.  Sweets, such as cake, pie, doughnuts, and cookies.  Fried foods.  What foods can I eat? Eat  nutrient-rich foods, which will nourish your body and keep you healthy. The food you should eat also will depend on several factors, including:  The calories you need.  The medicines you take.  Your weight.  Your blood glucose level.  Your blood pressure level.  Your cholesterol level.  You should eat a variety of foods, including:  Protein. ? Lean cuts of meat. ? Proteins low in saturated fats, such as fish, egg whites, and beans. Avoid processed meats.  Fruits and vegetables. ? Fruits and vegetables that may help control blood glucose levels, such as apples, mangoes, and yams.  Dairy products. ? Choose fat-free or low-fat dairy products, such as milk, yogurt, and cheese.  Grains, bread, pasta, and rice. ? Choose whole grain products, such as multigrain bread, whole oats, and brown rice. These foods may help control blood pressure.  Fats. ? Foods containing healthful fats, such as nuts, avocado, olive  oil, canola oil, and fish.  Does everyone with diabetes mellitus have the same meal plan? Because every person with diabetes mellitus is different, there is not one meal plan that works for everyone. It is very important that you meet with a dietitian who will help you create a meal plan that is just right for you. This information is not intended to replace advice given to you by your health care provider. Make sure you discuss any questions you have with your health care provider. Document Released: 12/17/2004 Document Revised: 08/28/2015 Document Reviewed: 02/16/2013 Elsevier Interactive Patient Education  2017 Elsevier Inc.   Contact number for scheduling NICU NAS tour is [phone].

## 2017-03-16 NOTE — Progress Notes (Signed)
HIGH-RISK PREGNANCY VISIT Patient name: [name] MRN [id number]  Date of birth: 07/28/1990 Chief Complaint:   High Risk Gestation (US today; + contractions)  History of Present Illness:   Kathleen Guthrieanya E Ranieri is a 26 y.o. [id number] female at 5054w4d with an Estimated Date of Delivery: 03/26/17 being seen today for ongoing management of a high-risk pregnancy complicated by gestational DM. She has not been checking her sugars, but has a hx of GDM, so has been watching her carbs.Still hasn't called nurse back for NAS tour, but asked for the # again.   Today she reports no complaints. Contractions: Irregular. Vag. Bleeding: None.  Movement: Present. denies leaking of fluid.  Review of Systems:   Pertinent items are noted in HPI Denies abnormal vaginal discharge w/ itching/odor/irritation, headaches, visual changes, shortness of breath, chest pain, abdominal pain, severe nausea/vomiting, or problems with urination or bowel movements unless otherwise stated above.    Pertinent History Reviewed:  Medical & Surgical Hx:   Past Medical History:  Diagnosis Date  . Gestational diabetes   . Panic attacks 2013   Was given Xanax; last had panic attack 2.5 yrs ago.  Marland Kitchen. Scoliosis   . Supervision of normal pregnancy 09/11/2013   Past Surgical History:  Procedure Laterality Date  . CESAREAN SECTION  2011   breech; third baby  . urethra stretched     Family History  Problem Relation Age of Onset  . Hypertension Mother   . Lupus Mother   . Mental illness Mother   . Bipolar disorder Mother   . ADD / ADHD Mother   . Bipolar disorder Brother   . ADD / ADHD Brother   . Heart attack Maternal Grandmother   . Heart disease Maternal Grandmother   . Hypertension Maternal Grandmother   . Cancer Maternal Grandfather   . Hypertension Paternal Grandmother   . Thyroid disease Maternal Aunt   . Heart disease Maternal Aunt   . Lupus Maternal Aunt   . Thyroid disease Maternal Uncle   . Heart attack Maternal Uncle    . Heart disease Maternal Uncle   . Lupus Maternal Uncle     Current Outpatient Medications:  .  buprenorphine (SUBUTEX) 8 MG SUBL SL tablet, DISSOLVE ONE TABLET UNDER THE TONGUE EVERY 8 HOURS, Disp: , Rfl: 0 .  Prenatal Vit-Fe Fumarate-FA (PRENATAL MULTIVITAMIN) TABS tablet, Take 1 tablet by mouth. Takes vit 3 times weekly, Disp: , Rfl:  Social History: Reviewed -  reports that  has never smoked. she has never used smokeless tobacco.   Physical Assessment:   Vitals:   03/16/17 1114  BP: 120/88  Pulse: 73  Weight: 191 lb (86.6 kg)  Body mass index is 31.78 kg/m.           Physical Examination:   General appearance: alert, well appearing, and in no distress  Mental status: alert, oriented to person, place, and time  Skin: warm & dry   Extremities: Edema: None    Cardiovascular: normal heart rate noted  Respiratory: normal respiratory effort, no distress  Abdomen: gravid, soft, non-tender  Pelvic: Cervical exam performed         Fetal Status:     Movement: Present    Fetal Surveillance Testing today: US  Results for orders placed or performed in visit on 03/16/17 (from the past 24 hour(s))  POCT urinalysis dipstick   Collection Time: 03/16/17 11:15 AM  Result Value Ref Range   Color, UA     Clarity, UA  Glucose, UA neg    Bilirubin, UA     Ketones, UA neg    Spec Grav, UA  1.010 - 1.025   Blood, UA neg    pH, UA  5.0 - 8.0   Protein, UA neg    Urobilinogen, UA  0.2 or 1.0 E.U./dL   Nitrite, UA neg    Leukocytes, UA Moderate (2+) (A) Negative   Appearance     Odor      Assessment & Plan:  1) High-risk pregnancy [id number] at 5177w4d with an Estimated Date of Delivery: 03/26/17   2) GDM, will start checking BS today,   3) Suboxone use, stable  Labs/procedures today: US 38+4 wks,cephalic,ant pl gr 1,fhr 124 bpm,afi 12.6 cm,left renal pelvis 5.663mm,right renal pelvis 5.4 mm WNL,EFW 3471 g 63%   Medications: Suboxone 8mg  Q 8 hrs  Treatment Plan:  Start QID blood  sugar testing, IOL 40 weeks.  CAN TRY STRIPPING MEMBRANES @ 39 WEEKS SO PT CAN POSSIBLY BE HOME FOR CHRISTMAS (5 day minimum stay wsuboxone   Follow-up: Return for Monday w/Kim for HROB.  Orders Placed This Encounter  Procedures  . US OB Follow Up  . POCT urinalysis dipstick   CRESENZO-DISHMAN,Strummer Canipe CNM 03/16/2017 12:15 PM

## 2017-03-16 NOTE — Progress Notes (Signed)
US 38+4 wks,cephalic,ant pl gr 1,fhr 124 bpm,afi 12.6 cm,left renal pelvis 5.333mm,right renal pelvis 5.4 mm WNL,EFW 3471 g 63%

## 2017-03-21 ENCOUNTER — Ambulatory Visit (INDEPENDENT_AMBULATORY_CARE_PROVIDER_SITE_OTHER): Payer: Medicaid Other | Admitting: Women's Health

## 2017-03-21 ENCOUNTER — Encounter: Payer: Self-pay | Admitting: Women's Health

## 2017-03-21 VITALS — BP 126/84 | HR 88 | Wt 191.0 lb

## 2017-03-21 DIAGNOSIS — Z1389 Encounter for screening for other disorder: Secondary | ICD-10-CM | POA: Diagnosis not present

## 2017-03-21 DIAGNOSIS — O24410 Gestational diabetes mellitus in pregnancy, diet controlled: Secondary | ICD-10-CM | POA: Diagnosis not present

## 2017-03-21 DIAGNOSIS — Z3A39 39 weeks gestation of pregnancy: Secondary | ICD-10-CM | POA: Diagnosis not present

## 2017-03-21 DIAGNOSIS — O0993 Supervision of high risk pregnancy, unspecified, third trimester: Secondary | ICD-10-CM

## 2017-03-21 DIAGNOSIS — Z331 Pregnant state, incidental: Secondary | ICD-10-CM

## 2017-03-21 LAB — POCT URINALYSIS DIPSTICK
GLUCOSE UA: NEGATIVE
Protein, UA: NEGATIVE

## 2017-03-21 NOTE — Patient Instructions (Signed)
Your induction is scheduled for 12/22 @ 11:45pm. Go to [facility], Maternity Admissions Unit (Emergency) entrance and let them know you are there to be induced. They will send someone from Labor & Delivery to come get you.    [name], I greatly value your feedback.  If you receive a survey following your visit with [place] today, we appreciate you taking the time to fill it out.  Thanks, [name], CNM, WHNP-BC   Call the office [phone]) or go to [facility] if:  You begin to have strong, frequent contractions  Your water breaks.  Sometimes it is a big gush of fluid, sometimes it is just a trickle that keeps getting your panties wet or running down your legs  You have vaginal bleeding.  It is normal to have a small amount of spotting if your cervix was checked.   You don't feel your baby moving like normal.  If you don't, get you something to eat and drink and lay down and focus on feeling your baby move.  You should feel at least 10 movements in 2 hours.  If you don't, you should call the office or go to [place] Contractions Contractions of the uterus can occur throughout pregnancy, but they are not always a sign that you are in labor. You may have practice contractions called Braxton Hicks contractions. These false labor contractions are sometimes confused with true labor. What are [place] contractions? Braxton Hicks contractions are tightening movements that occur in the muscles of the uterus before labor. Unlike true labor contractions, these contractions do not result in opening (dilation) and thinning of the cervix. Toward the end of pregnancy (32-34 weeks), Braxton Hicks contractions can happen more often and may become stronger. These contractions are sometimes difficult to tell apart from true labor because they can be very uncomfortable. You should not feel embarrassed if you go to the [facility] with false labor. Sometimes, the only way to  tell if you are in true labor is for your health care provider to look for changes in the cervix. The health care provider will do a physical exam and may monitor your contractions. If you are not in true labor, the exam should show that your cervix is not dilating and your water has not broken. If there are no prenatal problems or other health problems associated with your pregnancy, it is completely safe for you to be sent home with false labor. You may continue to have Braxton Hicks contractions until you go into true labor. How can I tell the difference between true labor and false labor?  Differences ? False labor ? Contractions last 30-70 seconds.: Contractions are usually shorter and not as strong as true labor contractions. ? Contractions become very regular.: Contractions are usually irregular. ? Discomfort is usually felt in the top of the uterus, and it spreads to the lower abdomen and low back.: Contractions are often felt in the front of the lower abdomen and in the groin. ? Contractions do not go away with walking.: Contractions may go away when you walk around or change positions while lying down. ? Contractions usually become more intense and increase in frequency.: Contractions get weaker and are shorter-lasting as time goes on. ? The cervix dilates and gets thinner.: The cervix usually does not dilate or become thin. Follow these instructions at home:  Take over-the-counter and prescription medicines only as told by your health care provider.  Keep up  with your usual exercises and follow other instructions from your health care provider.  Eat and drink lightly if you think you are going into labor.  If Braxton Hicks contractions are making you uncomfortable: ? Change your position from lying down or resting to walking, or change from walking to resting. ? Sit and rest in a tub of warm water. ? Drink enough fluid to keep your urine clear or pale yellow. Dehydration may cause  these contractions. ? Do slow and deep breathing several times an hour.  Keep all follow-up prenatal visits as told by your health care provider. This is important. Contact a health care provider if:  You have a fever.  You have continuous pain in your abdomen. Get help right away if:  Your contractions become stronger, more regular, and closer together.  You have fluid leaking or gushing from your vagina.  You pass blood-tinged mucus (bloody show).  You have bleeding from your vagina.  You have low back pain that you never had before.  You feel your baby's head pushing down and causing pelvic pressure.  Your baby is not moving inside you as much as it used to. Summary  Contractions that occur before labor are called Braxton Hicks contractions, false labor, or practice contractions.  Braxton Hicks contractions are usually shorter, weaker, farther apart, and less regular than true labor contractions. True labor contractions usually become progressively stronger and regular and they become more frequent.  Manage discomfort from [name] contractions by changing position, resting in a warm bath, drinking plenty of water, or practicing deep breathing. This information is not intended to replace advice given to you by your health care provider. Make sure you discuss any questions you have with your health care provider. Document Released: 03/22/2005 Document Revised: 02/09/2016 Document Reviewed: 02/09/2016 Elsevier Interactive Patient Education  2017 Elsevier Inc.   Gestational Diabetes Mellitus, Self Care When you have gestational diabetes (gestational diabetes mellitus), you must keep your blood sugar (glucose) under control. You can do this with: Nutrition. Exercise. Lifestyle changes. Medicines or insulin, if needed. Support from your doctors and others.  How do I manage my blood sugar? Check your blood sugar every day during pregnancy. Check it as often as  told. Call your doctor if your blood sugar is above your goal numbers for 2 tests in a row. Your doctor will set treatment goals for you. Try to have these blood sugars: After not eating for a long time (after fasting): at or below 95 mg/dL (5.3 mmol/L). After meals (postprandial): One hour after a meal: at or below 140 mg/dL (7.8 mmol/L). Two hours after a meal: at or below 120 mg/dL (6.7 mmol/L). A1c (hemoglobin A1c) level: 6-6.5%.  What do I need to know about high blood sugar? High blood sugar is called hyperglycemia. Know the early signs of high blood sugar. Signs include: Feeling: Thirsty. Hungry. Very tired. Needing to pee (urinate) more than usual. Blurry vision.  What do I need to know about low blood sugar? Low blood sugar is called hypoglycemia. This is when blood sugar is at or below 70 mg/dL (3.9 mmol/L). Symptoms may include: Feeling: Hungry. Worried or nervous (anxious). Sweaty or clammy. Confused. Dizzy. Sleepy. Sick to your stomach (nauseous). Having: A fast heartbeat. A headache. A change in vision. Jerky movements that you cannot control (seizure). Nightmares. Tingling or no feeling (numbness) around the mouth, lips, or tongue. Having trouble with: Talking. Paying attention (concentrating). Moving (coordination). Sleeping. Shaking. Passing out (fainting). Getting  upset easily (irritability).  Treating low blood sugar  To treat low blood sugar, eat or drink something sugary right away. If you can think clearly and swallow safely, follow the 15:15 rule: Take 15 grams of a fast-acting carb (carbohydrate). Some fast-acting carbs are: 1 tube of glucose gel. 3 sugar tablets (glucose pills). 6-8 pieces of hard candy. 4 oz (120 mL) of fruit juice. 4 oz (120 mL) regular (not diet) soda. Check your blood sugar 15 minutes after you take the carb. If your blood sugar is still at or below 70 mg/dL (3.9 mmol/L), take 15 grams of a carb again. If your blood  sugar does not go above 70 mg/dL (3.9 mmol/L) after 3 tries, get help right away. After your blood sugar goes back to normal, eat a meal or a snack within 1 hour.  Treating very low blood sugar If your blood sugar is at or below 54 mg/dL (3 mmol/L), you have very low blood sugar (severe hypoglycemia). This is an emergency. Do not wait to see if the symptoms will go away. Get medical help right away. Call your local emergency services (911 in the [place] Do not drive yourself to the [facility]. If you have very low blood sugar and you cannot eat or drink, you may need a glucagon shot (injection). A family member or friend should learn: How to check your blood sugar. How to give you a glucagon shot.  Ask your doctor if you need a glucagon shot kit at home. What else is important to manage my diabetes? Medicine Take your insulin and diabetes medicines as told. Do not run out of insulin or medicines. Adjust your insulin and diabetes medicines as told. Food  Make healthy food choices. These include: Chicken, fish, egg whites, and beans. Oats, whole wheat, bulgur, brown rice, quinoa, and millet. Fresh fruits and vegetables. Low-fat dairy products. Nuts, avocado, olive oil, and canola oil. Make an eating plan. A food specialist (dietitian) can help you. Follow instructions from your doctor about what you cannot eat or drink. Drink enough fluid to keep your pee (urine) clear or pale yellow. Eat healthy snacks between healthy meals. Keep track of carbs you eat. Read food labels. Learn about food serving sizes. Follow your sick day plan when you cannot eat or drink normally. Make this plan with your doctor. Activity Exercise 30 minutes or more a day or as much as told by your doctor. Talk with your doctor if you start a new exercise. Your doctor may need to adjust your insulin, medicines, or food. Lifestyle Do not drink alcohol. Do not use any tobacco products. If you need help quitting, ask  your doctor. Learn how to deal with stress. If you need help with this, ask your doctor. Body care  Stay up to date with your shots (immunizations). Brush your teeth and gums two times a day. Floss at least one time a day. Go to the dentist least one time every 6 months. Stay at a healthy weight while you are pregnant. General instructions Take over-the-counter and prescription medicines only as told by your doctor. Ask your doctor about risks of high blood pressure in pregnancy. These are called preeclampsia and eclampsia. Share your diabetes care plan with: Your work or school. People you live with. Check your pee for ketones: When you are sick. As told by your doctor. Ask your doctor: Do I need to meet with a diabetes educator? Where can I find a support group for people with diabetes?  Carry a card or wear jewelry that says that you have diabetes. Keep all follow-up visits with your doctor. This is important. Care after giving birth  Have your blood sugar checked 4-12 weeks after you give birth. Get checked for diabetes at least every 3 years. Where to find more information: To learn more about diabetes, visit: American Diabetes Association: www.diabetes.org/diabetes-basics/gestational Centers for Disease Control and Prevention (CDC): http://sanchez-watson.com/.pdf  This information is not intended to replace advice given to you by your health care provider. Make sure you discuss any questions you have with your health care provider. Document Released: 07/14/2015 Document Revised: 08/28/2015 Document Reviewed: 04/25/2015 Elsevier Interactive Patient Education  [employer].

## 2017-03-21 NOTE — Progress Notes (Signed)
   HIGH-RISK PREGNANCY VISIT Patient name: Kathleen Guthrieanya E Weight MRN [id number]  Date of birth: 01/24/1991 Chief Complaint:   Routine Prenatal Visit  History of Present Illness:   [name] is a 26 y.o. [id number] female at 5846w2d with an Estimated Date of Delivery: 03/26/17 being seen today for ongoing management of a high-risk pregnancy complicated by A1DM, just started checking sugars last 5 days.  Today she reports didn't bring log, but reports fbs- majority <90, checking 1hr pp almost all <140. Contractions: Not present. Vag. Bleeding: None.  Movement: Present. denies leaking of fluid.  Review of Systems:   Pertinent items are noted in HPI Denies abnormal vaginal discharge w/ itching/odor/irritation, headaches, visual changes, shortness of breath, chest pain, abdominal pain, severe nausea/vomiting, or problems with urination or bowel movements unless otherwise stated above. Pertinent History Reviewed:  Reviewed past medical,surgical, social, obstetrical and family history.  Reviewed problem list, medications and allergies. Physical Assessment:   Vitals:   03/21/17 1605  BP: 126/84  Pulse: 88  Weight: 191 lb (86.6 kg)  Body mass index is 31.78 kg/m.           Physical Examination:   General appearance: alert, well appearing, and in no distress  Mental status: alert, oriented to person, place, and time  Skin: warm & dry   Extremities: Edema: None    Cardiovascular: normal heart rate noted  Respiratory: normal respiratory effort, no distress  Abdomen: gravid, soft, non-tender  Pelvic: Cervical exam performed  Dilation: 3 Effacement (%): 70 Station: -2  Offered membrane sweeping, discussed r/b- pt decided to proceed, so membranes swept.   Fetal Status: Fetal Heart Rate (bpm): 145 Fundal Height: 37 cm Movement: Present Presentation: Vertex  Fetal Surveillance Testing today: FHT doppler   No results found for this or any previous visit (from the past 24 hour(s)).  Assessment & Plan:    1) High-risk pregnancy [id number] at 4646w2d with an Estimated Date of Delivery: 03/26/17   2) A1DM, stable, EFW 63% w/ AFI 12.6cm 12/12 @ 38.4wks  3) Subutex therapy  Labs/procedures today: sve, membrane sweeping  Treatment Plan:  IOL scheduled for 12/22 @ 2345, IOL form sent  Reviewed: Term labor symptoms and general obstetric precautions including but not limited to vaginal bleeding, contractions, leaking of fluid and fetal movement were reviewed in detail with the patient.  All questions were answered.  Follow-up: Return for 4-6 wks for pp visit.  Orders Placed This Encounter  Procedures  . POCT Urinalysis Dipstick   Marge DuncansBooker, Kimberly Randall CNM, [facility]BC 03/21/2017 5:20 PM

## 2017-03-21 NOTE — Treatment Plan (Signed)
Induction Assessment Scheduling Form Fax to [facility]:  [phone]  [name]                                                                                   DOB:  11/28/1990                                                            MRN:  [id number]                                                                     Phone #:   [phone]                         Provider:  [facility]  GP:  [id number]                                                            Estimated Date of Delivery: 03/26/17  Dating Criteria: LMP c/w 10wk u/s    Medical Indications for induction:  GDM Admission Date/Time:  12/22 @ 2345 Gestational age on admission:  40.0   Filed Weights   03/21/17 1605  Weight: 191 lb (86.6 kg)   HIV:   neg GBS:  neg  3/70/-2, vtx   Method of induction(proposed):  pitocin   Scheduling Provider Signature:  Marge DuncansBooker, [name], CNM                                            Today's Date:  03/21/2017

## 2017-03-22 ENCOUNTER — Inpatient Hospital Stay (EMERGENCY_DEPARTMENT_HOSPITAL)
Admission: AD | Admit: 2017-03-22 | Discharge: 2017-03-22 | Disposition: A | Discharge status: Home / Self Care | Payer: Medicaid Other | Source: Ambulatory Visit | Attending: Obstetrics and Gynecology | Admitting: Obstetrics and Gynecology

## 2017-03-22 ENCOUNTER — Other Ambulatory Visit: Payer: Self-pay

## 2017-03-22 ENCOUNTER — Telehealth (HOSPITAL_COMMUNITY): Payer: Self-pay | Admitting: *Deleted

## 2017-03-22 ENCOUNTER — Encounter (HOSPITAL_COMMUNITY): Payer: Self-pay | Admitting: *Deleted

## 2017-03-22 ENCOUNTER — Other Ambulatory Visit: Payer: Self-pay | Admitting: Family Medicine

## 2017-03-22 DIAGNOSIS — O0930 Supervision of pregnancy with insufficient antenatal care, unspecified trimester: Secondary | ICD-10-CM

## 2017-03-22 DIAGNOSIS — O471 False labor at or after 37 completed weeks of gestation: Secondary | ICD-10-CM

## 2017-03-22 DIAGNOSIS — O24410 Gestational diabetes mellitus in pregnancy, diet controlled: Secondary | ICD-10-CM

## 2017-03-22 LAB — URINALYSIS, ROUTINE W REFLEX MICROSCOPIC
Bilirubin Urine: NEGATIVE
Glucose, UA: NEGATIVE mg/dL
Ketones, ur: NEGATIVE mg/dL
NITRITE: NEGATIVE
PH: 5 (ref 5.0–8.0)
Protein, ur: NEGATIVE mg/dL
SPECIFIC GRAVITY, URINE: 1.017 (ref 1.005–1.030)

## 2017-03-22 NOTE — Discharge Instructions (Signed)

## 2017-03-22 NOTE — MAU Note (Signed)
Pt presents with c/o regular onset of ctxs.  Reports ctxs 3-7 minutes apart for past 6 hours.  Denies LOF.  Reports presence of bloody show.  Reports +FM.  Reports membranes stripped in office yesterday. Cervix 3cm, 70% effaced per pt report.

## 2017-03-22 NOTE — Telephone Encounter (Signed)
Preadmission screen  

## 2017-03-23 ENCOUNTER — Inpatient Hospital Stay (HOSPITAL_COMMUNITY): Payer: Medicaid Other | Admitting: Certified Registered Nurse Anesthetist

## 2017-03-23 ENCOUNTER — Inpatient Hospital Stay (HOSPITAL_COMMUNITY): Payer: Medicaid Other | Admitting: Anesthesiology

## 2017-03-23 ENCOUNTER — Encounter (HOSPITAL_COMMUNITY)
Admission: AD | Disposition: A | Discharge status: Home / Self Care | Payer: Self-pay | Source: Ambulatory Visit | Attending: Family Medicine

## 2017-03-23 ENCOUNTER — Inpatient Hospital Stay (HOSPITAL_COMMUNITY)
Admission: AD | Admit: 2017-03-23 | Discharge: 2017-03-25 | DRG: 819 | Disposition: A | Discharge status: Home / Self Care | Payer: Medicaid Other | Source: Ambulatory Visit | Attending: Family Medicine | Admitting: Family Medicine

## 2017-03-23 ENCOUNTER — Other Ambulatory Visit: Payer: Self-pay

## 2017-03-23 ENCOUNTER — Encounter (HOSPITAL_COMMUNITY): Payer: Self-pay | Admitting: *Deleted

## 2017-03-23 DIAGNOSIS — Z302 Encounter for sterilization: Secondary | ICD-10-CM | POA: Diagnosis not present

## 2017-03-23 DIAGNOSIS — O358XX0 Maternal care for other (suspected) fetal abnormality and damage, not applicable or unspecified: Secondary | ICD-10-CM | POA: Diagnosis present

## 2017-03-23 DIAGNOSIS — O24410 Gestational diabetes mellitus in pregnancy, diet controlled: Secondary | ICD-10-CM

## 2017-03-23 DIAGNOSIS — Z3A39 39 weeks gestation of pregnancy: Secondary | ICD-10-CM

## 2017-03-23 DIAGNOSIS — O34219 Maternal care for unspecified type scar from previous cesarean delivery: Secondary | ICD-10-CM | POA: Diagnosis present

## 2017-03-23 DIAGNOSIS — Z3483 Encounter for supervision of other normal pregnancy, third trimester: Secondary | ICD-10-CM | POA: Diagnosis present

## 2017-03-23 DIAGNOSIS — O0930 Supervision of pregnancy with insufficient antenatal care, unspecified trimester: Secondary | ICD-10-CM

## 2017-03-23 DIAGNOSIS — M419 Scoliosis, unspecified: Secondary | ICD-10-CM | POA: Diagnosis present

## 2017-03-23 DIAGNOSIS — O24420 Gestational diabetes mellitus in childbirth, diet controlled: Secondary | ICD-10-CM | POA: Diagnosis present

## 2017-03-23 DIAGNOSIS — IMO0002 Reserved for concepts with insufficient information to code with codable children: Secondary | ICD-10-CM | POA: Diagnosis present

## 2017-03-23 HISTORY — PX: TUBAL LIGATION: SHX77

## 2017-03-23 LAB — CBC
HCT: 35.7 % — ABNORMAL LOW (ref 36.0–46.0)
Hemoglobin: 11.8 g/dL — ABNORMAL LOW (ref 12.0–15.0)
MCH: 27.8 pg (ref 26.0–34.0)
MCHC: 33.1 g/dL (ref 30.0–36.0)
MCV: 84.2 fL (ref 78.0–100.0)
Platelets: 164 K/uL (ref 150–400)
RBC: 4.24 MIL/uL (ref 3.87–5.11)
RDW: 13.5 % (ref 11.5–15.5)
WBC: 10.4 K/uL (ref 4.0–10.5)

## 2017-03-23 LAB — GLUCOSE, CAPILLARY
GLUCOSE-CAPILLARY: 94 mg/dL (ref 65–99)
Glucose-Capillary: 80 mg/dL (ref 65–99)
Glucose-Capillary: 107 mg/dL — ABNORMAL HIGH (ref 65–99)

## 2017-03-23 LAB — HEPATITIS B SURFACE ANTIGEN: Hepatitis B Surface Ag: NEGATIVE

## 2017-03-23 LAB — URINALYSIS, ROUTINE W REFLEX MICROSCOPIC
Bilirubin Urine: NEGATIVE
Glucose, UA: NEGATIVE mg/dL
Hgb urine dipstick: NEGATIVE
KETONES UR: NEGATIVE mg/dL
LEUKOCYTES UA: NEGATIVE
NITRITE: NEGATIVE
PROTEIN: NEGATIVE mg/dL
Specific Gravity, Urine: 1.021 (ref 1.005–1.030)
pH: 5 (ref 5.0–8.0)

## 2017-03-23 LAB — TYPE AND SCREEN
ABO/RH(D): O POS
ANTIBODY SCREEN: NEGATIVE

## 2017-03-23 SURGERY — LIGATION, FALLOPIAN TUBE, POSTPARTUM
Anesthesia: Epidural | Site: Abdomen | Laterality: Bilateral | Wound class: Clean Contaminated

## 2017-03-23 MED ORDER — TERBUTALINE SULFATE 1 MG/ML IJ SOLN: 0.2500 mg | Freq: Once | INTRAMUSCULAR | Status: DC | PRN

## 2017-03-23 MED ORDER — BENZOCAINE-MENTHOL 20-0.5 % EX AERO: 1.0000 "application " | INHALATION_SPRAY | CUTANEOUS | Status: DC | PRN

## 2017-03-23 MED ORDER — SODIUM BICARBONATE 8.4 % IV SOLN
INTRAVENOUS | Status: DC | PRN
  Administered 2017-03-23 (×2): 10 mL via EPIDURAL

## 2017-03-23 MED ORDER — DIPHENHYDRAMINE HCL 25 MG PO CAPS: 25.0000 mg | ORAL_CAPSULE | Freq: Four times a day (QID) | ORAL | Status: DC | PRN

## 2017-03-23 MED ORDER — BUPIVACAINE HCL (PF) 0.25 % IJ SOLN
INTRAMUSCULAR | Status: DC | PRN
  Administered 2017-03-23: 10 mL via EPIDURAL

## 2017-03-23 MED ORDER — EPHEDRINE 5 MG/ML INJ: 10.0000 mg | INTRAVENOUS | Status: DC | PRN

## 2017-03-23 MED ORDER — FENTANYL 2.5 MCG/ML BUPIVACAINE 1/10 % EPIDURAL INFUSION (WH - ANES)
14.0000 mL/h | INTRAMUSCULAR | Status: DC | PRN
  Administered 2017-03-23 (×2): 14 mL/h via EPIDURAL
  Filled 2017-03-23 (×2): qty 100

## 2017-03-23 MED ORDER — WITCH HAZEL-GLYCERIN EX PADS: 1.0000 "application " | MEDICATED_PAD | CUTANEOUS | Status: DC | PRN

## 2017-03-23 MED ORDER — LIDOCAINE HCL (PF) 1 % IJ SOLN
30.0000 mL | INTRAMUSCULAR | Status: AC | PRN
  Administered 2017-03-23: 30 mL via SUBCUTANEOUS
  Filled 2017-03-23: qty 30

## 2017-03-23 MED ORDER — BUPRENORPHINE HCL 2 MG SL SUBL
4.0000 mg | SUBLINGUAL_TABLET | Freq: Once | SUBLINGUAL | Status: AC
  Administered 2017-03-23: 4 mg via SUBLINGUAL
  Filled 2017-03-23: qty 2

## 2017-03-23 MED ORDER — BUPRENORPHINE HCL 8 MG SL SUBL: 8.0000 mg | SUBLINGUAL_TABLET | Freq: Every day | SUBLINGUAL | Status: DC

## 2017-03-23 MED ORDER — DEXAMETHASONE SODIUM PHOSPHATE 4 MG/ML IJ SOLN
INTRAMUSCULAR | Status: AC
  Filled 2017-03-23: qty 1

## 2017-03-23 MED ORDER — OXYCODONE-ACETAMINOPHEN 5-325 MG PO TABS: 1.0000 | ORAL_TABLET | ORAL | Status: DC | PRN

## 2017-03-23 MED ORDER — LIDOCAINE HCL (CARDIAC) 20 MG/ML IV SOLN
INTRAVENOUS | Status: AC
  Filled 2017-03-23: qty 5

## 2017-03-23 MED ORDER — ZOLPIDEM TARTRATE 5 MG PO TABS: 5.0000 mg | ORAL_TABLET | Freq: Every evening | ORAL | Status: DC | PRN

## 2017-03-23 MED ORDER — BUPIVACAINE HCL (PF) 0.25 % IJ SOLN
INTRAMUSCULAR | Status: DC | PRN
  Administered 2017-03-23: 20 mL

## 2017-03-23 MED ORDER — SOD CITRATE-CITRIC ACID 500-334 MG/5ML PO SOLN
ORAL | Status: AC
  Administered 2017-03-23: 30 mL
  Filled 2017-03-23: qty 15

## 2017-03-23 MED ORDER — ACETAMINOPHEN 325 MG PO TABS: 650.0000 mg | ORAL_TABLET | ORAL | Status: DC | PRN

## 2017-03-23 MED ORDER — COCONUT OIL OIL: 1.0000 "application " | TOPICAL_OIL | Status: DC | PRN

## 2017-03-23 MED ORDER — OXYTOCIN 40 UNITS IN LACTATED RINGERS INFUSION - SIMPLE MED
1.0000 m[IU]/min | INTRAVENOUS | Status: DC
  Administered 2017-03-23: 2 m[IU]/min via INTRAVENOUS

## 2017-03-23 MED ORDER — LIDOCAINE HCL (CARDIAC) 20 MG/ML IV SOLN
INTRAVENOUS | Status: DC | PRN
  Administered 2017-03-23: 100 mg via INTRATRACHEAL

## 2017-03-23 MED ORDER — METOCLOPRAMIDE HCL 5 MG/ML IJ SOLN: 10.0000 mg | Freq: Once | INTRAMUSCULAR | Status: DC | PRN

## 2017-03-23 MED ORDER — SODIUM BICARBONATE 8.4 % IV SOLN
INTRAVENOUS | Status: AC
  Filled 2017-03-23: qty 50

## 2017-03-23 MED ORDER — LACTATED RINGERS IV SOLN: 500.0000 mL | INTRAVENOUS | Status: DC | PRN

## 2017-03-23 MED ORDER — LACTATED RINGERS IV SOLN: INTRAVENOUS | Status: DC

## 2017-03-23 MED ORDER — BUPIVACAINE HCL (PF) 0.25 % IJ SOLN
INTRAMUSCULAR | Status: AC
  Filled 2017-03-23: qty 30

## 2017-03-23 MED ORDER — LIDOCAINE-EPINEPHRINE (PF) 2 %-1:200000 IJ SOLN
INTRAMUSCULAR | Status: AC
  Filled 2017-03-23: qty 20

## 2017-03-23 MED ORDER — OXYTOCIN BOLUS FROM INFUSION
500.0000 mL | Freq: Once | INTRAVENOUS | Status: AC
  Administered 2017-03-23: 500 mL via INTRAVENOUS

## 2017-03-23 MED ORDER — ONDANSETRON HCL 4 MG PO TABS: 4.0000 mg | ORAL_TABLET | ORAL | Status: DC | PRN

## 2017-03-23 MED ORDER — IBUPROFEN 600 MG PO TABS
600.0000 mg | ORAL_TABLET | Freq: Four times a day (QID) | ORAL | Status: DC
  Administered 2017-03-23 – 2017-03-25 (×7): 600 mg via ORAL
  Filled 2017-03-23 (×7): qty 1

## 2017-03-23 MED ORDER — MEPERIDINE HCL 25 MG/ML IJ SOLN: 6.2500 mg | INTRAMUSCULAR | Status: DC | PRN

## 2017-03-23 MED ORDER — FENTANYL CITRATE (PF) 100 MCG/2ML IJ SOLN: 100.0000 ug | INTRAMUSCULAR | Status: DC | PRN

## 2017-03-23 MED ORDER — LIDOCAINE HCL (PF) 1 % IJ SOLN
INTRAMUSCULAR | Status: DC | PRN
  Administered 2017-03-23 (×2): 5 mL

## 2017-03-23 MED ORDER — PHENYLEPHRINE 40 MCG/ML (10ML) SYRINGE FOR IV PUSH (FOR BLOOD PRESSURE SUPPORT): 80.0000 ug | PREFILLED_SYRINGE | INTRAVENOUS | Status: DC | PRN

## 2017-03-23 MED ORDER — LACTATED RINGERS IV SOLN
500.0000 mL | Freq: Once | INTRAVENOUS | Status: AC
  Administered 2017-03-23: 500 mL via INTRAVENOUS

## 2017-03-23 MED ORDER — TETANUS-DIPHTH-ACELL PERTUSSIS 5-2.5-18.5 LF-MCG/0.5 IM SUSP
0.5000 mL | Freq: Once | INTRAMUSCULAR | Status: AC
  Administered 2017-03-24: 0.5 mL via INTRAMUSCULAR
  Filled 2017-03-23: qty 0.5

## 2017-03-23 MED ORDER — ONDANSETRON HCL 4 MG/2ML IJ SOLN: 4.0000 mg | Freq: Four times a day (QID) | INTRAMUSCULAR | Status: DC | PRN

## 2017-03-23 MED ORDER — SODIUM CHLORIDE 0.9 % IR SOLN
Status: DC | PRN
  Administered 2017-03-23: 1

## 2017-03-23 MED ORDER — ACETAMINOPHEN 325 MG PO TABS
650.0000 mg | ORAL_TABLET | ORAL | Status: DC | PRN
  Administered 2017-03-24 (×2): 650 mg via ORAL
  Filled 2017-03-23 (×2): qty 2

## 2017-03-23 MED ORDER — LACTATED RINGERS IV SOLN
INTRAVENOUS | Status: DC | PRN
  Administered 2017-03-23 (×2): via INTRAVENOUS

## 2017-03-23 MED ORDER — DIPHENHYDRAMINE HCL 50 MG/ML IJ SOLN
12.5000 mg | INTRAMUSCULAR | Status: DC | PRN
  Administered 2017-03-23: 12.5 mg via INTRAVENOUS
  Filled 2017-03-23: qty 1

## 2017-03-23 MED ORDER — SIMETHICONE 80 MG PO CHEW: 80.0000 mg | CHEWABLE_TABLET | ORAL | Status: DC | PRN

## 2017-03-23 MED ORDER — MIDAZOLAM HCL 5 MG/5ML IJ SOLN
INTRAMUSCULAR | Status: DC | PRN
  Administered 2017-03-23: 2 mg via INTRAVENOUS

## 2017-03-23 MED ORDER — SUCCINYLCHOLINE CHLORIDE 20 MG/ML IJ SOLN
INTRAMUSCULAR | Status: DC | PRN
  Administered 2017-03-23: 120 mg via INTRAVENOUS

## 2017-03-23 MED ORDER — OXYTOCIN 40 UNITS IN LACTATED RINGERS INFUSION - SIMPLE MED: 2.5000 [IU]/h | INTRAVENOUS | Status: DC

## 2017-03-23 MED ORDER — OXYCODONE-ACETAMINOPHEN 5-325 MG PO TABS: 2.0000 | ORAL_TABLET | ORAL | Status: DC | PRN

## 2017-03-23 MED ORDER — OXYTOCIN BOLUS FROM INFUSION: 500.0000 mL | Freq: Once | INTRAVENOUS | Status: DC

## 2017-03-23 MED ORDER — KETOROLAC TROMETHAMINE 30 MG/ML IJ SOLN
INTRAMUSCULAR | Status: AC
  Filled 2017-03-23: qty 1

## 2017-03-23 MED ORDER — FLEET ENEMA 7-19 GM/118ML RE ENEM: 1.0000 | ENEMA | RECTAL | Status: DC | PRN

## 2017-03-23 MED ORDER — BUPRENORPHINE HCL 2 MG SL SUBL
2.0000 mg | SUBLINGUAL_TABLET | Freq: Four times a day (QID) | SUBLINGUAL | Status: DC
  Administered 2017-03-23 – 2017-03-25 (×8): 2 mg via SUBLINGUAL
  Filled 2017-03-23 (×8): qty 1

## 2017-03-23 MED ORDER — PROPOFOL 10 MG/ML IV BOLUS
INTRAVENOUS | Status: AC
  Filled 2017-03-23: qty 20

## 2017-03-23 MED ORDER — ONDANSETRON HCL 4 MG/2ML IJ SOLN: 4.0000 mg | INTRAMUSCULAR | Status: DC | PRN

## 2017-03-23 MED ORDER — ONDANSETRON HCL 4 MG/2ML IJ SOLN
INTRAMUSCULAR | Status: AC
  Filled 2017-03-23: qty 2

## 2017-03-23 MED ORDER — PHENYLEPHRINE 40 MCG/ML (10ML) SYRINGE FOR IV PUSH (FOR BLOOD PRESSURE SUPPORT)
80.0000 ug | PREFILLED_SYRINGE | INTRAVENOUS | Status: DC | PRN
  Filled 2017-03-23: qty 10

## 2017-03-23 MED ORDER — SOD CITRATE-CITRIC ACID 500-334 MG/5ML PO SOLN: 30.0000 mL | ORAL | Status: DC | PRN

## 2017-03-23 MED ORDER — DIBUCAINE 1 % RE OINT: 1.0000 "application " | TOPICAL_OINTMENT | RECTAL | Status: DC | PRN

## 2017-03-23 MED ORDER — OXYTOCIN 40 UNITS IN LACTATED RINGERS INFUSION - SIMPLE MED
2.5000 [IU]/h | INTRAVENOUS | Status: DC
  Filled 2017-03-23: qty 1000

## 2017-03-23 MED ORDER — PROPOFOL 10 MG/ML IV BOLUS
INTRAVENOUS | Status: DC | PRN
  Administered 2017-03-23: 200 mg via INTRAVENOUS

## 2017-03-23 MED ORDER — ROCURONIUM BROMIDE 100 MG/10ML IV SOLN
INTRAVENOUS | Status: AC
  Filled 2017-03-23: qty 1

## 2017-03-23 MED ORDER — ONDANSETRON HCL 4 MG/2ML IJ SOLN
INTRAMUSCULAR | Status: DC | PRN
  Administered 2017-03-23: 4 mg via INTRAVENOUS

## 2017-03-23 MED ORDER — DEXAMETHASONE SODIUM PHOSPHATE 10 MG/ML IJ SOLN
INTRAMUSCULAR | Status: DC | PRN
  Administered 2017-03-23: 10 mg via INTRAVENOUS

## 2017-03-23 MED ORDER — MIDAZOLAM HCL 2 MG/2ML IJ SOLN
INTRAMUSCULAR | Status: AC
  Filled 2017-03-23: qty 2

## 2017-03-23 MED ORDER — LACTATED RINGERS IV SOLN
INTRAVENOUS | Status: DC
  Administered 2017-03-23: 12:00:00 via INTRAVENOUS

## 2017-03-23 MED ORDER — LACTATED RINGERS IV SOLN
INTRAVENOUS | Status: DC
  Administered 2017-03-23: 08:00:00 via INTRAVENOUS

## 2017-03-23 MED ORDER — PRENATAL MULTIVITAMIN CH
1.0000 | ORAL_TABLET | Freq: Every day | ORAL | Status: DC
  Administered 2017-03-24 – 2017-03-25 (×2): 1 via ORAL
  Filled 2017-03-23 (×2): qty 1

## 2017-03-23 MED ORDER — FENTANYL CITRATE (PF) 100 MCG/2ML IJ SOLN
INTRAMUSCULAR | Status: DC | PRN
  Administered 2017-03-23 (×2): 100 ug via INTRAVENOUS
  Administered 2017-03-23: 50 ug via INTRAVENOUS

## 2017-03-23 MED ORDER — LIDOCAINE HCL (PF) 1 % IJ SOLN: 30.0000 mL | INTRAMUSCULAR | Status: DC | PRN

## 2017-03-23 MED ORDER — BUPRENORPHINE HCL 2 MG SL SUBL
4.0000 mg | SUBLINGUAL_TABLET | SUBLINGUAL | Status: DC | PRN
  Administered 2017-03-23: 4 mg via SUBLINGUAL
  Filled 2017-03-23 (×2): qty 2

## 2017-03-23 MED ORDER — SENNOSIDES-DOCUSATE SODIUM 8.6-50 MG PO TABS
2.0000 | ORAL_TABLET | ORAL | Status: DC
  Administered 2017-03-24 – 2017-03-25 (×2): 2 via ORAL
  Filled 2017-03-23 (×2): qty 2

## 2017-03-23 MED ORDER — FENTANYL CITRATE (PF) 250 MCG/5ML IJ SOLN
INTRAMUSCULAR | Status: AC
  Filled 2017-03-23: qty 5

## 2017-03-23 SURGICAL SUPPLY — 25 items
ADH SKN CLS APL DERMABOND .7 (GAUZE/BANDAGES/DRESSINGS) ×1
BLADE SURG 11 STRL SS (BLADE) ×3 IMPLANT
CLIP FILSHIE TUBAL LIGA STRL (Clip) ×3 IMPLANT
CLOTH BEACON ORANGE TIMEOUT ST (SAFETY) ×3 IMPLANT
DERMABOND ADVANCED (GAUZE/BANDAGES/DRESSINGS) ×2
DERMABOND ADVANCED .7 DNX12 (GAUZE/BANDAGES/DRESSINGS) IMPLANT
DRSG OPSITE POSTOP 3X4 (GAUZE/BANDAGES/DRESSINGS) ×3 IMPLANT
DURAPREP 26ML APPLICATOR (WOUND CARE) ×3 IMPLANT
GLOVE BIOGEL PI IND STRL 7.0 (GLOVE) ×1 IMPLANT
GLOVE BIOGEL PI IND STRL 7.5 (GLOVE) ×1 IMPLANT
GLOVE BIOGEL PI INDICATOR 7.0 (GLOVE) ×2
GLOVE BIOGEL PI INDICATOR 7.5 (GLOVE) ×2
GLOVE ECLIPSE 7.5 STRL STRAW (GLOVE) ×3 IMPLANT
GOWN STRL REUS W/TWL LRG LVL3 (GOWN DISPOSABLE) ×6 IMPLANT
NEEDLE HYPO 22GX1.5 SAFETY (NEEDLE) ×3 IMPLANT
NS IRRIG 1000ML POUR BTL (IV SOLUTION) ×3 IMPLANT
PACK ABDOMINAL MINOR (CUSTOM PROCEDURE TRAY) ×3 IMPLANT
PROTECTOR NERVE ULNAR (MISCELLANEOUS) ×3 IMPLANT
SPONGE LAP 4X18 X RAY DECT (DISPOSABLE) IMPLANT
SUT VICRYL 0 UR6 27IN ABS (SUTURE) ×3 IMPLANT
SUT VICRYL 4-0 PS2 18IN ABS (SUTURE) ×3 IMPLANT
SYR CONTROL 10ML LL (SYRINGE) ×3 IMPLANT
TOWEL OR 17X24 6PK STRL BLUE (TOWEL DISPOSABLE) ×6 IMPLANT
TRAY FOLEY CATH SILVER 14FR (SET/KITS/TRAYS/PACK) ×3 IMPLANT
WATER STERILE IRR 1000ML POUR (IV SOLUTION) ×3 IMPLANT

## 2017-03-23 NOTE — MAU Note (Signed)
Pt presents with irregular ctxs, but states they are "unbearable".  Denies LOF, has bloody show.  Reports +FM.

## 2017-03-23 NOTE — Progress Notes (Signed)
LABOR PROGRESS NOTE  Yaakov Guthrieanya E Krock is a 26 y.o. [id number] at 655w4d  admitted for SOL. TOLAC  Subjective: Patient is comfortable during contractions, states she feels like contractions have lightened up   Objective: BP 126/88   Pulse 80   Temp 97.6 F (36.4 C) (Oral)   Resp 18   Ht 5\' 5"  (1.651 m)   Wt 192 lb (87.1 kg)   LMP 06/19/2016 (Exact Date)   SpO2 99%   BMI 31.95 kg/m  or  Vitals:   03/23/17 1030 03/23/17 1035 03/23/17 1040 03/23/17 1045  BP: (!) 129/96 133/82 (!) 126/93 126/88  Pulse:  82 83 80  Resp: 20 20 18 18   Temp:      TempSrc:      SpO2: 100% 99% 98% 99%  Weight:      Height:        AROM @0947 - clear moderate Dilation: 7 Effacement (%): 70 Station: -2 Presentation: Vertex Exam by:: Lanice ShirtsV. Reyanna Baley CNM  FHT: baseline rate 125, moderate varibility, + acel, early decel Toco: irregular moderate contractions   Labs: Lab Results  Component Value Date   WBC 10.4 03/23/2017   HGB 11.8 (L) 03/23/2017   HCT 35.7 (L) 03/23/2017   MCV 84.2 03/23/2017   PLT 164 03/23/2017    Patient Active Problem List   Diagnosis Date Noted  . Encounter for trial of labor 03/23/2017  . Indication for care in labor or delivery 03/23/2017  . Gestational diabetes 03/16/2017  . Pyelectasis of fetus on prenatal ultrasound 02/18/2017  . Late prenatal care 12/01/2016  . High-risk pregnancy 11/30/2016  . History of gestational diabetes in prior pregnancy, currently pregnant 11/30/2016  . 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 VBAC (vaginal birth after Cesarean) 08/07/2015    Assessment / Plan: 26 y.o. [id number] at 365w4d here for SOL  Labor: Progressing well, AROM @0947  Fetal Wellbeing:  Cat 1 Pain Control:  Plans epidural  Anticipated MOD:  SVD  Sharyon CableRogers, Zailyn Rowser C, CNM 03/23/2017, 10:49 AM

## 2017-03-23 NOTE — Anesthesia Preprocedure Evaluation (Addendum)
Anesthesia Evaluation  Patient identified by MRN, date of birth, ID band Patient awake    Reviewed: Allergy & Precautions, NPO status , Patient's Chart, lab work & pertinent test results  Airway Mallampati: II  TM Distance: >3 FB Neck ROM: Full    Dental no notable dental hx. (+) Poor Dentition   Pulmonary neg pulmonary ROS,    Pulmonary exam normal breath sounds clear to auscultation       Cardiovascular negative cardio ROS Normal cardiovascular exam Rhythm:Regular Rate:Normal     Neuro/Psych negative neurological ROS  negative psych ROS   GI/Hepatic negative GI ROS, Neg liver ROS,   Endo/Other  diabetes  Renal/GU negative Renal ROS  negative genitourinary   Musculoskeletal negative musculoskeletal ROS (+)   Abdominal   Peds negative pediatric ROS (+)  Hematology negative hematology ROS (+)   Anesthesia Other Findings Chronic subutex  Reproductive/Obstetrics negative OB ROS                             Anesthesia Physical Anesthesia Plan  ASA: II  Anesthesia Plan: General   Post-op Pain Management:    Induction: Intravenous  PONV Risk Score and Plan: 2 and Ondansetron  Airway Management Planned: Oral ETT  Additional Equipment:   Intra-op Plan:   Post-operative Plan:   Informed Consent: I have reviewed the patients History and Physical, chart, labs and discussed the procedure including the risks, benefits and alternatives for the proposed anesthesia with the patient or authorized representative who has indicated his/her understanding and acceptance.   Dental advisory given  Plan Discussed with: CRNA  Anesthesia Plan Comments: (Existing labor epidural will be used.   [facility]. [facility]       Anesthesia Quick Evaluation

## 2017-03-23 NOTE — MAU Note (Signed)
Contractions unbearable.  Cervix 5 cm at last exam.  Blood tinge mucus.  No leaking. Baby moving well.

## 2017-03-23 NOTE — Transfer of Care (Signed)
Immediate Anesthesia Transfer of Care Note  Patient: [name]  Procedure(s) Performed: POST PARTUM TUBAL LIGATION (Bilateral Abdomen)  Patient Location: PACU  Anesthesia Type:General  Level of Consciousness: awake, alert  and oriented  Airway & Oxygen Therapy: Patient Spontanous Breathing and Patient connected to nasal cannula oxygen  Post-op Assessment: Report given to RN and Post -op Vital signs reviewed and stable  Post vital signs: Reviewed and stable  Last Vitals:  Vitals:   03/23/17 1600 03/23/17 1615  BP: 129/89 115/83  Pulse: 98 (!) 104  Resp: 18 18  Temp:    SpO2:      Last Pain:  Vitals:   03/23/17 1515  TempSrc:   PainSc: 2       Patients Stated Pain Goal: 5 (03/23/17 [id number])  Complications: No apparent anesthesia complications

## 2017-03-23 NOTE — Anesthesia Pain Management Evaluation Note (Signed)
  CRNA Pain Management Visit Note  Patient: [name] E Granzow, 26 y.o., female  "[facility]. We have an anesthesia team available at all times to provide care throughout the hospital, including epidural management and anesthesia for C-section. I don't know your plan for the delivery whether it a natural birth, water birth, IV sedation, nitrous supplementation, doula or epidural, but we want to meet your pain goals."   1.Was your pain managed to your expectations on prior hospitalizations?   Yes   2.What is your expectation for pain management during this hospitalization?     Epidural  3.How can we help you reach that goal?   Record the patient's initial score and the patient's pain goal.   Pain: 6  Pain Goal: 8 [facility] wants you to be able to say your pain was always managed very well.  Laban EmperorMalinova,Roan Sawchuk Hristova 03/23/2017

## 2017-03-23 NOTE — H&P (Signed)
LABOR AND DELIVERY ADMISSION HISTORY AND PHYSICAL NOTE  [name] is a 26 y.o. female [phone] with IUP at 8865w4d by LMP presenting for SOL.  She reports positive fetal movement. She denies leakage of fluid or vaginal bleeding.  Prenatal History/Complications: PNC at [facility] Pregnancy complications:  - Previous C/S- TOLAC prior successful VBACx3, A1GDM, subutex   Past Medical History: Past Medical History:  Diagnosis Date  . Gestational diabetes   . Panic attacks 2013   Was given Xanax; last had panic attack 2.5 yrs ago.  Marland Kitchen. Scoliosis   . Supervision of normal pregnancy 09/11/2013    Past Surgical History: Past Surgical History:  Procedure Laterality Date  . CESAREAN SECTION  2011   breech; third baby  . urethra stretched      Obstetrical History: OB History    Gravida Para Term Preterm AB Living   8 6 5 1 1 6    SAB TAB Ectopic Multiple Live Births   1 0 0 0 6      Social History: Social History   Socioeconomic History  . Marital status: Married    Spouse name: None  . Number of children: None  . Years of education: None  . Highest education level: None  Social Needs  . Financial resource strain: None  . Food insecurity - worry: None  . Food insecurity - inability: None  . Transportation needs - medical: None  . Transportation needs - non-medical: None  Occupational History  . None  Tobacco Use  . Smoking status: Never Smoker  . Smokeless tobacco: Never Used  Substance and Sexual Activity  . Alcohol use: No  . Drug use: No    Comment: last in May  . Sexual activity: Yes    Birth control/protection: None  Other Topics Concern  . None  Social History Narrative  . None    Family History: Family History  Problem Relation Age of Onset  . Hypertension Mother   . Lupus Mother   . Mental illness Mother   . Bipolar disorder Mother   . ADD / ADHD Mother   . Bipolar disorder Brother   . ADD / ADHD Brother   . Heart attack Maternal Grandmother   . Heart  disease Maternal Grandmother   . Hypertension Maternal Grandmother   . Cancer Maternal Grandfather   . Hypertension Paternal Grandmother   . Thyroid disease Maternal Aunt   . Heart disease Maternal Aunt   . Lupus Maternal Aunt   . Thyroid disease Maternal Uncle   . Heart attack Maternal Uncle   . Heart disease Maternal Uncle   . Lupus Maternal Uncle     Allergies: No Known Allergies  Medications Prior to Admission  Medication Sig Dispense Refill Last Dose  . buprenorphine (SUBUTEX) 8 MG SUBL SL tablet DISSOLVE ONE TABLET UNDER THE TONGUE EVERY 8 HOURS  0 03/22/2017 at Unknown time  . Prenatal Vit-Fe Fumarate-FA (PRENATAL MULTIVITAMIN) TABS tablet Take 1 tablet by mouth. Takes vit 3 times weekly   03/20/2017     Review of Systems  All systems reviewed and negative except as stated in HPI  Physical Exam Blood pressure 126/88, pulse 80, temperature 97.6 F (36.4 C), temperature source Oral, resp. rate 18, height 5\' 5"  (1.651 m), weight 192 lb (87.1 kg), last menstrual period 06/19/2016, SpO2 99 %, not currently breastfeeding. General appearance: alert, cooperative and no distress Lungs: clear to auscultation bilaterally Heart: regular rate and rhythm Abdomen: soft, non-tender; bowel sounds normal Extremities:  No calf swelling or tenderness Presentation: cephalic by digital exam  Fetal monitoring: baseline 125/ moderate/ + accel/ no decel  Uterine activity: irregular Dilation: 7 Effacement (%): 70 Station: -2 Exam by:: Lanice ShirtsV. Debbera Wolken CNM   Prenatal labs: ABO, Rh: --/--/O POS (12/19 0740) Antibody: NEG (12/19 0740) Rubella:    RPR: Non Reactive (11/05 1042)  HBsAg:    HIV:   Non reactive (11/05) GC/Chlamydia: Negative (11/28) GBS: Negative (11/28 0748)  2 hr Glucola:fasting 99- A1GDM  Genetic screening:  Late to care Anatomy US: normal   [facility]  Initiated Care at  23wk  FOB   Dating By LMP c/w U/S 10wk  Pap 12/01/16 NEG  GC/CT Initial:           -/-      36+wks:-/-  Genetic Screen Late entry to care 23.3wk  CF screen declined  Anatomic US Normal female  Flu vaccine   Tdap Recommended ~ 28wks  Glucose Screen  2 hr 99/117/116 (ABNORMAL)  GBS neg  Feed Preference Bottle (maybe some breast to help ease w/d)  Contraception BTL (consent 8/26)  Circumcision At FT  Childbirth Classes declined  Pediatrician [facility]   Prenatal Transfer Tool  Maternal Diabetes: Yes:  Diabetes Type:  Diet controlled Genetic Screening: Declined- late to care Maternal Ultrasounds/Referrals: Normal Fetal Ultrasounds or other Referrals:  None Maternal Substance Abuse:  Yes:  Type: Other: Subutex Significant Maternal Medications:  None Significant Maternal Lab Results: Lab values include: Group B Strep negative  Results for orders placed or performed during the hospital encounter of 03/23/17 (from the past 24 hour(s))  CBC   Collection Time: 03/23/17  7:40 AM  Result Value Ref Range   WBC 10.4 4.0 - 10.5 K/uL   RBC 4.24 3.87 - 5.11 MIL/uL   Hemoglobin 11.8 (L) 12.0 - 15.0 g/dL   HCT 16.135.7 (L) 09.636.0 - 04.546.0 %   MCV 84.2 78.0 - 100.0 fL   MCH 27.8 26.0 - 34.0 pg   MCHC 33.1 30.0 - 36.0 g/dL   RDW 40.913.5 81.111.5 - 91.415.5 %   Platelets 164 150 - 400 K/uL  Type and screen   Collection Time: 03/23/17  7:40 AM  Result Value Ref Range   ABO/RH(D) O POS    Antibody Screen NEG    Sample Expiration 03/26/2017   Glucose, capillary   Collection Time: 03/23/17  9:45 AM  Result Value Ref Range   Glucose-Capillary 94 65 - 99 mg/dL   Comment 1 Notify RN    Comment 2 Document in Chart   Results for orders placed or performed during the hospital encounter of 03/22/17 (from the past 24 hour(s))  Urinalysis, Routine w reflex microscopic   Collection Time: 03/22/17  1:10 PM  Result Value Ref Range   Color, Urine YELLOW YELLOW   APPearance HAZY (A) CLEAR   Specific Gravity, Urine 1.017 1.005 - 1.030   pH 5.0 5.0 - 8.0   Glucose, UA NEGATIVE NEGATIVE mg/dL   Hgb urine  dipstick LARGE (A) NEGATIVE   Bilirubin Urine NEGATIVE NEGATIVE   Ketones, ur NEGATIVE NEGATIVE mg/dL   Protein, ur NEGATIVE NEGATIVE mg/dL   Nitrite NEGATIVE NEGATIVE   Leukocytes, UA LARGE (A) NEGATIVE   RBC / HPF 6-30 0 - 5 RBC/hpf   WBC, UA TOO NUMEROUS TO COUNT 0 - 5 WBC/hpf   Bacteria, UA RARE (A) NONE SEEN   Squamous Epithelial / LPF 6-30 (A) NONE SEEN   Mucus PRESENT  Patient Active Problem List   Diagnosis Date Noted  . Encounter for trial of labor 03/23/2017  . Indication for care in labor or delivery 03/23/2017  . Gestational diabetes 03/16/2017  . Pyelectasis of fetus on prenatal ultrasound 02/18/2017  . Late prenatal care 12/01/2016  . High-risk pregnancy 11/30/2016  . History of gestational diabetes in prior pregnancy, currently pregnant 11/30/2016  . 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 VBAC (vaginal birth after Cesarean) 08/07/2015    Assessment: DONELL SLIWINSKI is a 25 y.o. P7T0626 at [redacted]w[redacted]d here for SOL  #Labor: Progressing well  #Pain: Plans epidural  #FWB: Cat 1 #ID:  GBS neg #MOF: Breast/ bottle  #MOC:BTL #Circ:  No  Sharyon Cable, CNM 03/23/2017, 10:52 AM

## 2017-03-23 NOTE — Op Note (Signed)
[name] E Perkovich 03/23/2017  PREOPERATIVE DIAGNOSES: Multiparity, undesired fertility  POSTOPERATIVE DIAGNOSES: Multiparity, undesired fertility  PROCEDURE:  Postpartum Bilateral Tubal Sterilization using Filshie Clips   SURGEON: Dr.  Lyndel SafeKimberly Newton and Dr. Caryl AdaJazma Sherol Sabas  ANESTHESIA:  General and local analgesia using 30 ml of 0.5% Marcaine  COMPLICATIONS:  None immediate.  ESTIMATED BLOOD LOSS: 5 ml.  FLUIDS: 700 ml LR.  URINE OUTPUT:  125 ml of clear urine.  INDICATIONS:  26 y.o. [id number] with undesired fertility,status post vaginal delivery, desires permanent sterilization.  Other reversible forms of contraception were discussed with patient; she declines all other modalities. Risks of procedure discussed with patient including but not limited to: risk of regret, permanence of method, bleeding, infection, injury to surrounding organs and need for additional procedures.  Failure risk of 1 -2 % with increased risk of ectopic gestation if pregnancy occurs was also discussed with patient.      FINDINGS:  Normal uterus, tubes, and ovaries.  PROCEDURE DETAILS: The patient was taken to the operating room where she was put under general anesthesia due to not being able to dose up epidural to surgical level.  She was then placed in the dorsal supine position and prepped and draped in sterile fashion.  After an adequate timeout was performed, attention was turned to the patient's abdomen where a small transverse skin incision was made under the umbilical fold. The incision was taken down to the layer of fascia using the scalpel, and fascia was extended bilaterally bluntly. The peritoneum was entered in a sharp fashion. Attention was then turned to the patient's uterus, and left fallopian tube was identified and followed out to the fimbriated end.  A Filshie clip was placed on the left fallopian tube about 3 cm from the cornual attachment, with care given to incorporate the underlying mesosalpinx.  A  similar process was carried out on the right side allowing for bilateral tubal sterilization.  Good hemostasis was noted overall.  The instruments were then removed from the patient's abdomen and the fascial incision was repaired with 0 Vicryl. Local analgesia was injected around incision and the skin was closed with a 4-0 Vicryl subcuticular stitch. The patient tolerated the procedure well.  Instrument, sponge, and needle counts were correct times two.  The patient was then taken to the recovery room awake and in stable condition.   Caryl AdaJazma Latonga Ponder, DO OB Fellow

## 2017-03-23 NOTE — Anesthesia Preprocedure Evaluation (Signed)
Anesthesia Evaluation  Patient identified by MRN, date of birth, ID band Patient awake    Reviewed: Allergy & Precautions, H&P , NPO status , Patient's Chart, lab work & pertinent test results  History of Anesthesia Complications Negative for: history of anesthetic complications  Airway Mallampati: II  TM Distance: >3 FB Neck ROM: full    Dental no notable dental hx. (+) Teeth Intact   Pulmonary neg pulmonary ROS,    Pulmonary exam normal breath sounds clear to auscultation       Cardiovascular negative cardio ROS Normal cardiovascular exam Rhythm:regular Rate:Normal     Neuro/Psych negative neurological ROS  negative psych ROS   GI/Hepatic negative GI ROS, Neg liver ROS,   Endo/Other  negative endocrine ROSdiabetes  Renal/GU negative Renal ROS  negative genitourinary   Musculoskeletal   Abdominal   Peds  Hematology negative hematology ROS (+)   Anesthesia Other Findings   Reproductive/Obstetrics (+) Pregnancy                             Anesthesia Physical  Anesthesia Plan  ASA: II  Anesthesia Plan: Epidural   Post-op Pain Management:    Induction:   PONV Risk Score and Plan:   Airway Management Planned:   Additional Equipment:   Intra-op Plan:   Post-operative Plan:   Informed Consent: I have reviewed the patients History and Physical, chart, labs and discussed the procedure including the risks, benefits and alternatives for the proposed anesthesia with the patient or authorized representative who has indicated his/her understanding and acceptance.       Plan Discussed with:   Anesthesia Plan Comments:         Anesthesia Quick Evaluation  

## 2017-03-23 NOTE — Anesthesia Procedure Notes (Signed)
Procedure Name: Intubation Date/Time: 03/23/2017 5:00 PM Performed by: Hewitt Blade, CRNA Pre-anesthesia Checklist: Patient identified, Emergency Drugs available, Suction available and Patient being monitored Patient Re-evaluated:Patient Re-evaluated prior to induction Oxygen Delivery Method: Circle system utilized Preoxygenation: Pre-oxygenation with 100% oxygen Induction Type: IV induction, Cricoid Pressure applied and Rapid sequence Laryngoscope Size: Mac and 3 Grade View: Grade I Tube type: Oral Tube size: 7.0 mm Number of attempts: 1 Airway Equipment and Method: Stylet Placement Confirmation: ETT inserted through vocal cords under direct vision,  positive ETCO2 and breath sounds checked- equal and bilateral Secured at: 21 cm Tube secured with: Tape Dental Injury: Teeth and Oropharynx as per pre-operative assessment

## 2017-03-23 NOTE — Anesthesia Procedure Notes (Signed)
Epidural Patient location during procedure: OB  Staffing Anesthesiologist: Halyn Flaugher, MD Performed: anesthesiologist   Preanesthetic Checklist Completed: patient identified, site marked, surgical consent, pre-op evaluation, timeout performed, IV checked, risks and benefits discussed and monitors and equipment checked  Epidural Patient position: sitting Prep: DuraPrep Patient monitoring: heart rate, continuous pulse ox and blood pressure Approach: midline Location: L3-L4 Injection technique: LOR saline  Needle:  Needle type: Tuohy  Needle gauge: 17 G Needle length: 9 cm and 9 Needle insertion depth: 7 cm Catheter type: closed end flexible Catheter size: 20 Guage Catheter at skin depth: 11 cm Test dose: negative  Assessment Events: blood not aspirated, injection not painful, no injection resistance, negative IV test and no paresthesia  Additional Notes Patient identified. Risks/Benefits/Options discussed with patient including but not limited to bleeding, infection, nerve damage, paralysis, failed block, incomplete pain control, headache, blood pressure changes, nausea, vomiting, reactions to medication both or allergic, itching and postpartum back pain. Confirmed with bedside nurse the patient's most recent platelet count. Confirmed with patient that they are not currently taking any anticoagulation, have any bleeding history or any family history of bleeding disorders. Patient expressed understanding and wished to proceed. All questions were answered. Sterile technique was used throughout the entire procedure. Please see nursing notes for vital signs. Test dose was given through epidural needle and negative prior to continuing to dose epidural or start infusion. Warning signs of high block given to the patient including shortness of breath, tingling/numbness in hands, complete motor block, or any concerning symptoms with instructions to call for help. Patient was given  instructions on fall risk and not to get out of bed. All questions and concerns addressed with instructions to call with any issues.     

## 2017-03-23 NOTE — Progress Notes (Addendum)
Labor Progress Note [name] is a [name] at 1270w4d presented for SOL  S: [facility]   O:  BP (!) 146/93   Pulse (!) 116   Temp 97.6 F (36.4 C) (Oral)   Resp 18   Ht 5\' 5"  (1.651 m)   Wt 192 lb (87.1 kg)   LMP 06/19/2016 (Exact Date)   SpO2 98%   BMI 31.95 kg/m  EFM: 120/moderate /+ accel with early decelerations  Toco: 3-395minutes  CVE: Dilation: 7 Effacement (%): 80 Station: -2 Presentation: Vertex Exam by:: Leanna SatoA. Gray RN    A&P: [name] 5770w4d for SOL #Labor: Progressing well. Pitocin- continue increasing titration  #Pain: Epidural- consult with anesthesia with pain, anesthesia recommends 4mg  of subutex for one dose due to patient not getting fully comfortable with epidural while on subutex  #FWB: Cat 1 #GBS negative   Sharyon CableVeronica C Nefi Musich, CNM 1:49 PM

## 2017-03-24 ENCOUNTER — Encounter (HOSPITAL_COMMUNITY): Payer: Self-pay

## 2017-03-24 ENCOUNTER — Encounter (HOSPITAL_COMMUNITY): Payer: Self-pay | Admitting: Family Medicine

## 2017-03-24 LAB — RPR: RPR: NONREACTIVE

## 2017-03-24 LAB — GLUCOSE, CAPILLARY: Glucose-Capillary: 105 mg/dL — ABNORMAL HIGH (ref 65–99)

## 2017-03-24 LAB — RUBELLA SCREEN: Rubella: 1.85 index (ref >0.99)

## 2017-03-24 NOTE — Anesthesia Postprocedure Evaluation (Signed)
Anesthesia Post Note  Patient: Yaakov Guthrieanya E Savino  Procedure(s) Performed: AN AD HOC LABOR EPIDURAL     Patient location during evaluation: Mother Baby Anesthesia Type: Epidural Level of consciousness: awake and alert and oriented Pain management: satisfactory to patient Vital Signs Assessment: post-procedure vital signs reviewed and stable Respiratory status: spontaneous breathing and nonlabored ventilation Cardiovascular status: stable Postop Assessment: no headache, no backache, no signs of nausea or vomiting, adequate PO intake and patient able to bend at knees (patient up walking) Anesthetic complications: no    Last Vitals:  Vitals:   03/24/17 0206 03/24/17 0635  BP: 122/79 115/87  Pulse: 79 65  Resp: 16 16  Temp: 36.5 C 36.6 C  SpO2: 100% 100%    Last Pain:  Vitals:   03/24/17 0635  TempSrc: Oral  PainSc: 0-No pain   Pain Goal: Patients Stated Pain Goal: 5 (03/23/17 [id number])               Madison HickmanGREGORY,Haniyyah Sakuma

## 2017-03-24 NOTE — Anesthesia Postprocedure Evaluation (Signed)
Anesthesia Post Note  Patient: [name]  Procedure(s) Performed: POST PARTUM TUBAL LIGATION (Bilateral Abdomen)     Patient location during evaluation: Mother Baby Anesthesia Type: Epidural Level of consciousness: awake and alert and oriented Pain management: satisfactory to patient Vital Signs Assessment: post-procedure vital signs reviewed and stable Respiratory status: spontaneous breathing and nonlabored ventilation Cardiovascular status: stable Postop Assessment: no headache, no backache, no signs of nausea or vomiting, adequate PO intake and patient able to bend at knees (patient up walking) Anesthetic complications: no    Last Vitals:  Vitals:   03/24/17 0206 03/24/17 0635  BP: 122/79 115/87  Pulse: 79 65  Resp: 16 16  Temp: 36.5 C 36.6 C  SpO2: 100% 100%    Last Pain:  Vitals:   03/24/17 0635  TempSrc: Oral  PainSc: 0-No pain   Pain Goal: Patients Stated Pain Goal: 5 (03/23/17 [id number])               Madison HickmanGREGORY,[name]

## 2017-03-24 NOTE — Progress Notes (Signed)
Post Partum Day #1/POD#1 from BTL Subjective: no complaints, up ad lib and tolerating PO; willing to pump if NICU teams feels it will be helpful for baby in NAS; chart review shows elevated BPs during recovery last PM, but has been normotensive since on MBU  Objective: Blood pressure 115/87, pulse 65, temperature 97.9 F (36.6 C), temperature source Oral, resp. rate 16, height 5\' 5"  (1.651 m), weight 81.9 kg (180 lb 9.6 oz), last menstrual period 06/19/2016, SpO2 100 %, unknown if currently breastfeeding.  Physical Exam:  General: alert, cooperative and no distress Lochia: appropriate Uterine Fundus: firm Incision: tubal incision with honeycomb intact DVT Evaluation: No evidence of DVT seen on physical exam.  Recent Labs    03/23/17 0740  HGB 11.8*  HCT 35.7*    Assessment/Plan: Plan for discharge tomorrow and Social Work consult  Will watch BPs   LOS: 1 day   Cam HaiSHAW, KIMBERLY CNM 03/24/2017, 7:47 AM
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
## 2017-03-25 MED ORDER — IBUPROFEN 600 MG PO TABS: 600.0000 mg | ORAL_TABLET | Freq: Four times a day (QID) | ORAL | 0 refills | Status: AC

## 2017-03-25 NOTE — Discharge Instructions (Signed)
Postpartum Care After Vaginal Delivery °The period of time right after you deliver your newborn is called the postpartum period. °What kind of medical care will I receive? °· You may continue to receive fluids and medicines through an IV tube inserted into one of your veins. °· If an incision was made near your vagina (episiotomy) or if you had some vaginal tearing during delivery, cold compresses may be placed on your episiotomy or your tear. This helps to reduce pain and swelling. °· You may be given a squirt bottle to use when you go to the bathroom. You may use this until you are comfortable wiping as usual. To use the squirt bottle, follow these steps: °? Before you urinate, fill the squirt bottle with warm water. Do not use hot water. °? After you urinate, while you are sitting on the toilet, use the squirt bottle to rinse the area around your urethra and vaginal opening. This rinses away any urine and blood. °? You may do this instead of wiping. As you start healing, you may use the squirt bottle before wiping yourself. Make sure to wipe gently. °? Fill the squirt bottle with clean water every time you use the bathroom. °· You will be given sanitary pads to wear. °How can I expect to feel? °· You may not feel the need to urinate for several hours after delivery. °· You will have some soreness and pain in your abdomen and vagina. °· If you are breastfeeding, you may have uterine contractions every time you breastfeed for up to several weeks postpartum. Uterine contractions help your uterus return to its normal size. °· It is normal to have vaginal bleeding (lochia) after delivery. The amount and appearance of lochia is often similar to a menstrual period in the first week after delivery. It will gradually decrease over the next few weeks to a dry, yellow-brown discharge. For most women, lochia stops completely by 6-8 weeks after delivery. Vaginal bleeding can vary from woman to woman. °· Within the first few  days after delivery, you may have breast engorgement. This is when your breasts feel heavy, full, and uncomfortable. Your breasts may also throb and feel hard, tightly stretched, warm, and tender. After this occurs, you may have milk leaking from your breasts. Your health care provider can help you relieve discomfort due to breast engorgement. Breast engorgement should go away within a few days. °· You may feel more sad or worried than normal due to hormonal changes after delivery. These feelings should not last more than a few days. If these feelings do not go away after several days, speak with your health care provider. °How should I care for myself? °· Tell your health care provider if you have pain or discomfort. °· Drink enough water to keep your urine clear or pale yellow. °· Wash your hands thoroughly with soap and water for at least 20 seconds after changing your sanitary pads, after using the toilet, and before holding or feeding your baby. °· If you are not breastfeeding, avoid touching your breasts a lot. Doing this can make your breasts produce more milk. °· If you become weak or lightheaded, or you feel like you might faint, ask for help before: °? Getting out of bed. °? Showering. °· Change your sanitary pads frequently. Watch for any changes in your flow, such as a sudden increase in volume, a change in color, the passing of large blood clots. If you pass a blood clot from your vagina, save it   to show to your health care provider. Do not flush blood clots down the toilet without having your health care provider look at them. °· Make sure that all your vaccinations are up to date. This can help protect you and your baby from getting certain diseases. You may need to have immunizations done before you leave the hospital. °· If desired, talk with your health care provider about methods of family planning or birth control (contraception). °How can I start bonding with my baby? °Spending as much time as  possible with your baby is very important. During this time, you and your baby can get to know each other and develop a bond. Having your baby stay with you in your room (rooming in) can give you time to get to know your baby. Rooming in can also help you become comfortable caring for your baby. Breastfeeding can also help you bond with your baby. °How can I plan for returning home with my baby? °· Make sure that you have a car seat installed in your vehicle. °? Your car seat should be checked by a certified car seat installer to make sure that it is installed safely. °? Make sure that your baby fits into the car seat safely. °· Ask your health care provider any questions you have about caring for yourself or your baby. Make sure that you are able to contact your health care provider with any questions after leaving the hospital. °This information is not intended to replace advice given to you by your health care provider. Make sure you discuss any questions you have with your health care provider. °Document Released: 01/17/2007 Document Revised: 08/25/2015 Document Reviewed: 02/24/2015 °Elsevier Interactive Patient Education © 2018 [employer] ° °

## 2017-03-25 NOTE — Discharge Summary (Signed)
OB Discharge Summary     Patient Name: [name] DOB: 03/05/1991 MRN: [id number]  Date of admission: 03/23/2017 Delivering MD: Sharyon CableOGERS, VERONICA C   Date of discharge: 03/25/2017  Admitting diagnosis: 39.5 WEEKS CTX Intrauterine pregnancy: 4064w4d     Secondary diagnosis:  Active Problems:   Encounter for trial of labor   Indication for care in labor or delivery  Additional problems: A1DM     Discharge diagnosis: Term Pregnancy Delivered and VBAC                                                                                                Post partum procedures:postpartum tubal ligation  Augmentation:   Complications: None  Hospital course:  Onset of Labor With Vaginal Delivery     26 y.o. yo [id number] at 2364w4d was admitted in Active Labor on 03/23/2017. Patient had an uncomplicated labor course as follows:  Membrane Rupture Time/Date: 9:47 AM ,03/23/2017   Intrapartum Procedures: Episiotomy: None [1]                                         Lacerations:  None [1]  Patient had a delivery of a Viable infant. 03/23/2017  Information for the patient's newborn:  Efrain SellaMoore, Boy Marcine [[id number]][[id number]]       Pateint had an uncomplicated postpartum course.  She is ambulating, tolerating a regular diet, passing flatus, and urinating well. Patient is discharged home in stable condition on 03/25/17.   Physical exam  Vitals:   03/24/17 0635 03/24/17 0737 03/24/17 1712 03/25/17 0518  BP: 115/87  115/85 123/85  Pulse: 65  74 63  Resp: 16  20 20   Temp: 97.9 F (36.6 C)  97.7 F (36.5 C)   TempSrc: Oral  Oral   SpO2: 100%  99%   Weight:  81.9 kg (180 lb 9.6 oz)  83.9 kg (185 lb)  Height:       General: alert, cooperative and no distress Lochia: appropriate Uterine Fundus: firm Incision: Healing well with no significant drainage DVT Evaluation: No evidence of DVT seen on physical exam. Negative Homan's sign. No cords or calf tenderness. Labs: Lab Results  Component Value Date    WBC 10.4 03/23/2017   HGB 11.8 (L) 03/23/2017   HCT 35.7 (L) 03/23/2017   MCV 84.2 03/23/2017   PLT 164 03/23/2017   CMP Latest Ref Rng & Units 12/09/2011  Glucose 70 - 99 mg/dL 696(E106(H)  BUN 6 - 23 mg/dL 14  Creatinine 9.520.50 - 8.411.10 mg/dL 3.240.62  Sodium 401135 - 027145 mEq/L 139  Potassium 3.5 - 5.1 mEq/L 3.9  Chloride 96 - 112 mEq/L 103  CO2 19 - 32 mEq/L 22  Calcium 8.4 - 10.5 mg/dL 9.7    Discharge instruction: per After Visit Summary and "Baby and Me Booklet".  After visit meds:  Allergies as of 03/25/2017   No Known Allergies     Medication List    TAKE these medications   buprenorphine 8 MG Subl SL  tablet Commonly known as:  SUBUTEX DISSOLVE 1/4 film (2mg )  UNDER THE TONGUE EVERY 8 HOURS   ibuprofen 600 MG tablet Commonly known as:  ADVIL,MOTRIN Take 1 tablet (600 mg total) by mouth every 6 (six) hours.   prenatal multivitamin Tabs tablet Take 1 tablet by mouth. Takes vit 3 times weekly       Diet: routine diet  Activity: Advance as tolerated. Pelvic rest for 6 weeks.   Outpatient follow up:see below Follow up Appt: Future Appointments  Date Time Provider Department Center  04/25/2017 10:00 AM Cheral MarkerBooker, Kimberly R, CNM [facility]   Follow up Visit:No Follow-up on file.  Postpartum contraception: Tubal Ligation  Newborn Data: Live born female  Birth Weight: 7 lb 10.8 oz (3480 g) APGAR: 6, 7  Newborn Delivery   Birth date/time:  03/23/2017 14:38:00 Delivery type:  VBAC, Spontaneous     Baby Feeding: Bottle Disposition:NICU/hopefully will be able to room in this afternoon   03/25/2017 CRESENZO-DISHMAN,Lavar Rosenzweig, CNM

## 2017-03-27 ENCOUNTER — Inpatient Hospital Stay (HOSPITAL_COMMUNITY): Payer: Medicaid Other

## 2017-04-20 ENCOUNTER — Encounter (HOSPITAL_COMMUNITY): Payer: Self-pay

## 2017-04-25 ENCOUNTER — Ambulatory Visit: Payer: Medicaid Other | Admitting: Women's Health

## 2017-05-04 ENCOUNTER — Ambulatory Visit: Payer: Medicaid Other | Admitting: Advanced Practice Midwife

## 2017-05-11 ENCOUNTER — Ambulatory Visit (INDEPENDENT_AMBULATORY_CARE_PROVIDER_SITE_OTHER): Payer: Medicaid Other | Admitting: Advanced Practice Midwife

## 2017-05-11 ENCOUNTER — Encounter: Payer: Self-pay | Admitting: Advanced Practice Midwife

## 2017-05-11 ENCOUNTER — Other Ambulatory Visit: Payer: Self-pay

## 2017-05-11 NOTE — Progress Notes (Signed)
[name] is a 27 y.o. who presents for a postpartum visit. She is 6 weeks postpartum following a spontaneous vaginal delivery. I have fully reviewed the prenatal and intrapartum course. The delivery was at 39.4 gestational weeks.  Anesthesia: epidural. Postpartum course has been unevenftul. Baby's course has been uneventful. Baby is feeding by breast until 2 weeks ago. Bleeding: staining only. Bowel function is normal. Bladder function is normal. Patient is not sexually active. Contraception method is tubal ligation. Postpartum depression screening: negative.   Current Outpatient Medications:  .  buprenorphine (SUBUTEX) 8 MG SUBL SL tablet, DISSOLVE 1/4 film (2mg )  UNDER THE TONGUE EVERY 8 HOURS, Disp: , Rfl: 0 .  ibuprofen (ADVIL,MOTRIN) 600 MG tablet, Take 1 tablet (600 mg total) by mouth every 6 (six) hours. (Patient not taking: Reported on 05/11/2017), Disp: 30 tablet, Rfl: 0 .  Prenatal Vit-Fe Fumarate-FA (PRENATAL MULTIVITAMIN) TABS tablet, Take 1 tablet by mouth. Takes vit 3 times weekly, Disp: , Rfl:   Review of Systems   Constitutional: Negative for fever and chills Eyes: Negative for visual disturbances Respiratory: Negative for shortness of breath, dyspnea Cardiovascular: Negative for chest pain or palpitations  Gastrointestinal: Negative for vomiting, diarrhea and constipation Genitourinary: Negative for dysuria and urgency Musculoskeletal: Negative for back pain, joint pain, myalgias  Neurological: Negative for dizziness and headaches    Objective:     Vitals:   05/11/17 1134  BP: 126/82  Pulse: 70   General:  alert, cooperative and no distress   Breasts:  negative  Lungs: clear to auscultation bilaterally  Heart:  regular rate and rhythm  Abdomen: Soft, nontender   Vulva:  normal  Vagina: normal vagina  Cervix:  closed  Corpus: Well involuted     Rectal Exam: no hemorrhoids        Assessment:    normal postpartum exam.  Plan:   1. Contraception: tubal  ligation 2. Follow up in:  3 years for pap or as needed.

## 2017-06-20 ENCOUNTER — Telehealth: Payer: Self-pay | Admitting: *Deleted

## 2017-06-20 NOTE — Telephone Encounter (Signed)
Patient called stating she has been having rectal bleeding for the past 3 months. States the bleeding seems to be getting worse and at times will fill the toilet when she uses the bathroom. Will get in to see Drenda FreezeFran tomorrow.

## 2017-06-21 ENCOUNTER — Ambulatory Visit: Payer: Medicaid Other | Admitting: Advanced Practice Midwife

## 2017-06-21 ENCOUNTER — Other Ambulatory Visit: Payer: Self-pay

## 2017-06-21 ENCOUNTER — Encounter: Payer: Self-pay | Admitting: Physician Assistant

## 2017-06-21 ENCOUNTER — Encounter: Payer: Self-pay | Admitting: Advanced Practice Midwife

## 2017-06-21 VITALS — BP 120/74 | HR 95 | Ht 65.0 in | Wt 176.0 lb

## 2017-06-21 DIAGNOSIS — K921 Melena: Secondary | ICD-10-CM

## 2017-06-21 MED ORDER — PHENYLEPHRINE IN HARD FAT 0.25 % RE SUPP: 1.0000 | Freq: Two times a day (BID) | RECTAL | 3 refills | Status: AC

## 2017-06-21 NOTE — Progress Notes (Signed)
Referral appt made at [facility] for April 4 @1015  with Salome ArntJennifer Lemon. Pt informed.

## 2017-06-21 NOTE — Progress Notes (Signed)
[facility] Visit  Patient name: [name] MRN [id number]  Date of birth: [birth year]  CC & HPI:  [name] is a 27 y.o. Caucasian female presenting today for bright red rectal bleeding with every BM for about a month  Had a baby a few months ago, had some then, but not much. Now says toilet bowl is filled w/blood.  Stools are soft, denies constipation. Not taking NSAIDS.  No pain/itching.   Pertinent History Reviewed:  Medical & Surgical Hx:   Past Medical History:  Diagnosis Date  . Gestational diabetes   . Panic attacks 2013   Was given Xanax; last had panic attack 2.5 yrs ago.  Marland Kitchen Scoliosis   . Supervision of normal pregnancy 09/11/2013   Past Surgical History:  Procedure Laterality Date  . CESAREAN SECTION  2011   breech; third baby  . TUBAL LIGATION Bilateral 03/23/2017   Procedure: POST PARTUM TUBAL LIGATION;  Surgeon: Federico Flake, MD;  Location: [facility];  Service: Gynecology;  Laterality: Bilateral;  . urethra stretched     Family History  Problem Relation Age of Onset  . Hypertension Mother   . Lupus Mother   . Mental illness Mother   . Bipolar disorder Mother   . ADD / ADHD Mother   . Bipolar disorder Brother   . ADD / ADHD Brother   . Heart attack Maternal Grandmother   . Heart disease Maternal Grandmother   . Hypertension Maternal Grandmother   . Cancer Maternal Grandfather   . Hypertension Paternal Grandmother   . Thyroid disease Maternal Aunt   . Heart disease Maternal Aunt   . Lupus Maternal Aunt   . Thyroid disease Maternal Uncle   . Heart attack Maternal Uncle   . Heart disease Maternal Uncle   . Lupus Maternal Uncle     Current Outpatient Medications:  .  buprenorphine (SUBUTEX) 8 MG SUBL SL tablet, DISSOLVE 1/4 film (2mg )  UNDER THE TONGUE EVERY 8 HOURS, Disp: , Rfl: 0 .  ibuprofen (ADVIL,MOTRIN) 600 MG tablet, Take 1 tablet (600 mg total) by mouth every 6 (six) hours. (Patient not taking: Reported on 05/11/2017),  Disp: 30 tablet, Rfl: 0 .  Prenatal Vit-Fe Fumarate-FA (PRENATAL MULTIVITAMIN) TABS tablet, Take 1 tablet by mouth. Takes vit 3 times weekly, Disp: , Rfl:  Social History: Reviewed -  reports that  has never smoked. she has never used smokeless tobacco.  Review of Systems:   Constitutional: Negative for fever and chills Eyes: Negative for visual disturbances Respiratory: Negative for shortness of breath, dyspnea Cardiovascular: Negative for chest pain or palpitations  Gastrointestinal: Negative for vomiting, diarrhea and constipation; no abdominal pain Genitourinary: Negative for dysuria and urgency, vaginal irritation or itching Musculoskeletal: Negative for back pain, joint pain, myalgias  Neurological: Negative for dizziness and headaches    Objective Findings:    Physical Examination: General appearance - well appearing, and in no distress Mental status - alert, oriented to person, place, and time Chest:  Normal respiratory effort Heart - normal rate and regular rhythm Abdomen:  Soft, nontender Rectal:  No visible/palpable hemorrhoids. Hemocult + Musculoskeletal:  Normal range of motion without pain Extremities:  No edema    No results found for this or any previous visit (from the past 24 hour(s)).    Assessment & Plan:  A:   hematochezia P:  Prep-H supp BID  3 hemocult cards to bring back to [place] for development  Referred to GI in [place]  4/14Scarlette Jacobson.   [name] CNM 06/21/2017 3:10 PM     `

## 2017-06-24 ENCOUNTER — Telehealth: Payer: Self-pay | Admitting: *Deleted

## 2017-06-24 NOTE — Telephone Encounter (Signed)
Informed patient [facility] carries suppositories over the counter. Verbalized understanding and will pick up.

## 2017-07-07 ENCOUNTER — Ambulatory Visit: Payer: Medicaid Other | Admitting: Physician Assistant

## 2017-07-12 ENCOUNTER — Encounter (INDEPENDENT_AMBULATORY_CARE_PROVIDER_SITE_OTHER): Payer: Self-pay

## 2021-05-06 ENCOUNTER — Emergency Department (HOSPITAL_COMMUNITY)
Admission: EM | Admit: 2021-05-06 | Discharge: 2021-05-06 | Disposition: A | Discharge status: Home / Self Care | Payer: Medicaid Other | Attending: Emergency Medicine | Admitting: Emergency Medicine

## 2021-05-06 ENCOUNTER — Other Ambulatory Visit: Payer: Self-pay

## 2021-05-06 ENCOUNTER — Encounter (HOSPITAL_COMMUNITY): Payer: Self-pay | Admitting: *Deleted

## 2021-05-06 DIAGNOSIS — J040 Acute laryngitis: Secondary | ICD-10-CM | POA: Insufficient documentation

## 2021-05-06 DIAGNOSIS — Z20822 Contact with and (suspected) exposure to covid-19: Secondary | ICD-10-CM | POA: Insufficient documentation

## 2021-05-06 DIAGNOSIS — J0100 Acute maxillary sinusitis, unspecified: Secondary | ICD-10-CM | POA: Insufficient documentation

## 2021-05-06 LAB — RESP PANEL BY RT-PCR (FLU A&B, COVID) ARPGX2
Influenza A by PCR: NEGATIVE
Influenza B by PCR: NEGATIVE
SARS Coronavirus 2 by RT PCR: NEGATIVE

## 2021-05-06 MED ORDER — CLINDAMYCIN HCL 300 MG PO CAPS: 300.0000 mg | ORAL_CAPSULE | Freq: Three times a day (TID) | ORAL | 0 refills | Status: AC

## 2021-05-06 NOTE — ED Provider Notes (Signed)
[facility] Provider Note   CSN: [id number] Arrival date & time: 05/06/21  1248     History  Chief Complaint  Patient presents with   Sore Throat    [name] is a 31 y.o. female with no significant past medical history but with occasional episodes of sinus infections presenting with a 2-day history of nasal and sinus congestion, pain and pressure along with purulent nasal discharge.  She also endorses laryngitis, states she lost her voice yesterday evening and is a little better today but her voice is not full.  She denies sore throat, no fevers or chills, no difficulty swallowing, denies shortness of breath, cough or wheezing.  She reports a history of sinus infections.  She has been using guaifenesin without improvement in her symptoms.  The history is provided by the patient.      Home Medications Prior to Admission medications   Medication Sig Start Date End Date Taking? Authorizing Provider  clindamycin (CLEOCIN) 300 MG capsule Take 1 capsule (300 mg total) by mouth 3 (three) times daily for 7 days. 05/06/21 05/13/21 Yes Dominyk Law, Almyra Free, PA-C  buprenorphine (SUBUTEX) 8 MG SUBL SL tablet DISSOLVE 1/4 film (2mg )  UNDER THE TONGUE EVERY 8 HOURS 10/21/16   [provider]  ibuprofen (ADVIL,MOTRIN) 600 MG tablet Take 1 tablet (600 mg total) by mouth every 6 (six) hours. Patient not taking: Reported on 05/11/2017 03/25/17   Cresenzo-Dishmon, Joaquim Lai, CNM  phenylephrine (,USE FOR PREPARATION-H,) 0.25 % suppository Place 1 suppository rectally 2 (two) times daily. 06/21/17   Cresenzo-Dishmon, Joaquim Lai, CNM  Prenatal Vit-Fe Fumarate-FA (PRENATAL MULTIVITAMIN) TABS tablet Take 1 tablet by mouth. Takes vit 3 times weekly    [provider]      Allergies    Patient has no known allergies.    Review of Systems   Review of Systems  Constitutional:  Negative for chills and fever.  HENT:  Positive for congestion, rhinorrhea, sinus pain and voice change.  Negative for ear pain, facial swelling, sinus pressure, sore throat and trouble swallowing.   Eyes:  Negative for discharge.  Respiratory:  Positive for cough. Negative for wheezing and stridor.   Cardiovascular:  Negative for chest pain.  Gastrointestinal:  Negative for abdominal pain.  Genitourinary: Negative.   All other systems reviewed and are negative.  Physical Exam Updated Vital Signs BP (!) 151/105 (BP Location: Right Arm)    Pulse 100    Temp 97.7 F (36.5 C) (Oral)    Resp 15    Ht 5\' 5"  (1.651 m)    Wt 77.6 kg    LMP 04/10/2021    SpO2 94%    BMI 28.46 kg/m  Physical Exam Constitutional:      Appearance: She is well-developed.  HENT:     Head: Normocephalic and atraumatic.     Right Ear: Tympanic membrane and ear canal normal.     Left Ear: Tympanic membrane and ear canal normal.     Nose: Mucosal edema and congestion present. No rhinorrhea.     Right Sinus: Maxillary sinus tenderness present.     Left Sinus: Maxillary sinus tenderness present.     Mouth/Throat:     Mouth: Mucous membranes are moist.     Pharynx: Oropharynx is clear. Uvula midline. No oropharyngeal exudate or posterior oropharyngeal erythema.     Tonsils: No tonsillar abscesses.  Eyes:     Conjunctiva/sclera: Conjunctivae normal.  Cardiovascular:     Rate and Rhythm: Normal rate.  Heart sounds: Normal heart sounds.  Pulmonary:     Effort: Pulmonary effort is normal. No respiratory distress.     Breath sounds: No wheezing, rhonchi or rales.  Musculoskeletal:        General: Normal range of motion.  Skin:    General: Skin is warm and dry.     Findings: No rash.  Neurological:     Mental Status: She is alert and oriented to person, place, and time.    ED Results / Procedures / Treatments   Labs (all labs ordered are listed, but only abnormal results are displayed) Labs Reviewed  RESP PANEL BY RT-PCR (FLU A&B, COVID) [id number]    EKG None  Radiology No results  found.  Procedures Procedures    Medications Ordered in ED Medications - No data to display  ED Course/ Medical Decision Making/ A&P                           Medical Decision Making Patient with symptoms and exam consistent with acute sinusitis.  Although she has not recognized postnasal drip I suspect that this may be contributing to her hoarseness of voice.  She does not have full laryngitis but her voice is very raspy currently.  She has no respiratory distress.  No wheezing, no rhonchi.  She was treated with antibiotics, encouraged to continue with her guaifenesin and a decongestant.  She did have a respiratory panel collected upon her first arrival here, this is currently pending.  Her symptoms do not suggest COVID or influenza, however she was advised that if these tests are positive she will need to be under home quarantine for total of 7 days from onset of symptoms (yesterday).  She does have MyChart.  We also discussed voice rest, ibuprofen.  Return precautions were outlined.  Risk OTC drugs. Prescription drug management. Decision regarding hospitalization. Risk Details: Patient is stable, there is no indication for hospitalization for this condition.           Final Clinical Impression(s) / ED Diagnoses Final diagnoses:  Acute maxillary sinusitis, recurrence not specified  Laryngitis    Rx / DC Orders ED Discharge Orders          Ordered    clindamycin (CLEOCIN) 300 MG capsule  3 times daily        05/06/21 1418              Evalee Jefferson, PA-C 05/06/21 1445    Godfrey Pick, MD 05/08/21 [phone]

## 2021-05-06 NOTE — ED Notes (Signed)
Pt stated she could not wait for updated vital signs. Pt verbalized understanding.

## 2021-05-06 NOTE — ED Triage Notes (Signed)
Pt c/o sore throat, nasal congestion and drainage, face pressure x 2 days. Denies fever.

## 2021-05-06 NOTE — Discharge Instructions (Addendum)
You have been prescribed antibiotics for your sinus infection.  I also recommend you continuing your Robitussin, make sure you are drinking plenty of water with this to help loosen and expectorate the congestion.  Also recommend ibuprofen and voice rest to help with your laryngitis.  Please get rechecked for any worsening symptoms.  You have been screened for COVID-19 and the flu today.  This test is currently pending, check your MyChart, if your COVID test is positive (which I doubt) you will however need to stay home and quarantine for total of 7 days from the date of onset of symptoms.,  Of course if this test is negative you can return to work tomorrow.

## 2021-11-24 ENCOUNTER — Encounter (HOSPITAL_COMMUNITY): Payer: Self-pay | Admitting: Emergency Medicine

## 2021-11-24 ENCOUNTER — Emergency Department (HOSPITAL_COMMUNITY): Payer: Self-pay

## 2021-11-24 ENCOUNTER — Emergency Department (HOSPITAL_COMMUNITY)
Admission: EM | Admit: 2021-11-24 | Discharge: 2021-11-24 | Disposition: A | Discharge status: Home / Self Care | Payer: Self-pay | Attending: Emergency Medicine | Admitting: Emergency Medicine

## 2021-11-24 ENCOUNTER — Other Ambulatory Visit: Payer: Self-pay

## 2021-11-24 DIAGNOSIS — Y9301 Activity, walking, marching and hiking: Secondary | ICD-10-CM | POA: Insufficient documentation

## 2021-11-24 DIAGNOSIS — S51812A Laceration without foreign body of left forearm, initial encounter: Secondary | ICD-10-CM

## 2021-11-24 DIAGNOSIS — W1789XA Other fall from one level to another, initial encounter: Secondary | ICD-10-CM | POA: Insufficient documentation

## 2021-11-24 DIAGNOSIS — Z23 Encounter for immunization: Secondary | ICD-10-CM | POA: Insufficient documentation

## 2021-11-24 MED ORDER — LIDOCAINE-EPINEPHRINE (PF) 2 %-1:200000 IJ SOLN
INTRAMUSCULAR | Status: AC
  Filled 2021-11-24: qty 20

## 2021-11-24 MED ORDER — TETANUS-DIPHTH-ACELL PERTUSSIS 5-2.5-18.5 LF-MCG/0.5 IM SUSY
0.5000 mL | PREFILLED_SYRINGE | Freq: Once | INTRAMUSCULAR | Status: AC
  Administered 2021-11-24: 0.5 mL via INTRAMUSCULAR
  Filled 2021-11-24: qty 0.5

## 2021-11-24 MED ORDER — LIDOCAINE-EPINEPHRINE 2 %-1:100000 IJ SOLN
20.0000 mL | Freq: Once | INTRAMUSCULAR | Status: AC
  Administered 2021-11-24: 20 mL
  Filled 2021-11-24: qty 20

## 2021-11-24 MED ORDER — LORAZEPAM 1 MG PO TABS
1.0000 mg | ORAL_TABLET | Freq: Once | ORAL | Status: AC
  Administered 2021-11-24: 1 mg via ORAL
  Filled 2021-11-24: qty 1

## 2021-11-24 MED ORDER — LIDOCAINE-EPINEPHRINE (PF) 1 %-1:200000 IJ SOLN: 20.0000 mL | Freq: Once | INTRAMUSCULAR | Status: DC

## 2021-11-24 MED ORDER — BACITRACIN ZINC 500 UNIT/GM EX OINT
TOPICAL_OINTMENT | CUTANEOUS | Status: DC
  Filled 2021-11-24: qty 0.9

## 2021-11-24 NOTE — ED Notes (Addendum)
Pt left prior to receiving paperwork, dressing

## 2021-11-24 NOTE — ED Triage Notes (Signed)
Pt states went through the porch last night around 1830, she has lac to the left forearm

## 2021-11-24 NOTE — ED Provider Notes (Signed)
[name] Provider Note   CSN: [id number] Arrival date & time: 11/24/21  4098     History  Chief Complaint  Patient presents with   Laceration         [name] is a 31 y.o. female.  31 year old female who presents to the emergency department with a laceration of her left forearm and right elbow pain.  Patient states that she was walking on her porch when there was a loose board that she fell through.  Says that she landed on her bilateral forearms.  Says that she sustained a laceration to her left forearm as well as bruising to her right elbow that hurts.  Denies any head strike or LOC.  Not on blood thinners.  No numbness or weakness of her arms.  Says that this happened at 530 pm yesterday and last Tdap was at some time in 2018.  No foreign body sensation in left forearm.   Laceration      Home Medications Prior to Admission medications   Medication Sig Start Date End Date Taking? Authorizing Provider  buprenorphine (SUBUTEX) 8 MG SUBL SL tablet DISSOLVE 1/4 film (2mg )  UNDER THE TONGUE EVERY 8 HOURS 10/21/16   [provider]  ibuprofen (ADVIL,MOTRIN) 600 MG tablet Take 1 tablet (600 mg total) by mouth every 6 (six) hours. Patient not taking: Reported on 05/11/2017 03/25/17   Cresenzo-Dishmon, 03/27/17, CNM  phenylephrine (,USE FOR PREPARATION-H,) 0.25 % suppository Place 1 suppository rectally 2 (two) times daily. 06/21/17   Cresenzo-Dishmon, 06/23/17, CNM  Prenatal Vit-Fe Fumarate-FA (PRENATAL MULTIVITAMIN) TABS tablet Take 1 tablet by mouth. Takes vit 3 times weekly    [provider]      Allergies    Patient has no known allergies.    Review of Systems   Review of Systems  Physical Exam Updated Vital Signs BP 133/83   Pulse 70   Temp (!) 97.4 F (36.3 C) (Oral)   Resp 16   Ht 5\' 5"  (1.651 m)   Wt 79.4 kg   LMP 10/29/2021 Comment: tubal ligation  SpO2 100%   BMI 29.12 kg/m  Physical Exam Vitals and nursing note  reviewed.  Constitutional:      General: She is not in acute distress.    Appearance: She is well-developed.  HENT:     Head: Normocephalic and atraumatic.     Right Ear: External ear normal.     Left Ear: External ear normal.     Nose: Nose normal.  Eyes:     Extraocular Movements: Extraocular movements intact.     Conjunctiva/sclera: Conjunctivae normal.     Pupils: Pupils are equal, round, and reactive to light.  Neck:     Comments: No midline C-spine tenderness Cardiovascular:     Rate and Rhythm: Normal rate and regular rhythm.  Pulmonary:     Effort: Pulmonary effort is normal. No respiratory distress.  Abdominal:     General: Abdomen is flat. There is no distension.     Palpations: Abdomen is soft.  Musculoskeletal:        General: No swelling.     Cervical back: Normal range of motion and neck supple.     Right lower leg: No edema.     Left lower leg: No edema.     Comments: 5 cm laceration to left forearm.  Skin:    General: Skin is warm and dry.     Capillary Refill: Capillary refill takes less than 2  seconds.  Neurological:     Mental Status: She is alert and oriented to person, place, and time. Mental status is at baseline.  Psychiatric:        Mood and Affect: Mood normal.     ED Results / Procedures / Treatments   Labs (all labs ordered are listed, but only abnormal results are displayed) Labs Reviewed - No data to display  EKG None  Radiology DG Elbow 2 Views Right  Result Date: 11/24/2021 CLINICAL DATA:  Patient fell through porch yesterday, pain and bruising of the posterior elbow. EXAM: RIGHT ELBOW - 2 VIEW COMPARISON:  None Available. FINDINGS: There is no evidence of fracture, dislocation, or joint effusion. There is no evidence of arthropathy or other focal bone abnormality. Soft tissues are unremarkable. No radiopaque foreign body. IMPRESSION: Negative right elbow radiographs. Electronically Signed   By: Emmaline Kluver M.D.   On: 11/24/2021  07:54    Procedures .Marland KitchenLaceration Repair  Date/Time: 11/24/2021 8:27 AM  Performed by: Rondel Baton, MD Authorized by: Rondel Baton, MD   Anesthesia:    Anesthesia method:  Local infiltration   Local anesthetic:  Lidocaine 1% WITH epi Laceration details:    Location: L forearm.   Length (cm):  5   Depth (mm):  2 Pre-procedure details:    Preparation:  Patient was prepped and draped in usual sterile fashion Exploration:    Wound exploration: wound explored through full range of motion and entire depth of wound visualized     Wound extent: no foreign bodies/material noted     Contaminated: no   Treatment:    Area cleansed with:  Saline   Amount of cleaning:  Extensive   Irrigation solution:  Sterile saline   Irrigation method:  Pressure wash   Debridement:  Minimal Skin repair:    Repair method:  Sutures   Suture size:  4-0   Suture material:  Prolene   Number of sutures:  5 Approximation:    Approximation:  Close Repair type:    Repair type:  Simple Post-procedure details:    Dressing:  Antibiotic ointment and non-adherent dressing   Procedure completion:  Tolerated well, no immediate complications     Medications Ordered in ED Medications  Tdap (BOOSTRIX) injection 0.5 mL (0.5 mLs Intramuscular Given 11/24/21 0722)  lidocaine-EPINEPHrine (XYLOCAINE W/EPI) 2 %-1:100000 (with pres) injection 20 mL (20 mLs Other Given 11/24/21 0727)  LORazepam (ATIVAN) tablet 1 mg (1 mg Oral Given 11/24/21 9509)    ED Course/ Medical Decision Making/ A&P                           Medical Decision Making Amount and/or Complexity of Data Reviewed Radiology: ordered.  Risk OTC drugs. Prescription drug management.   [name] is a 31 y.o. female who presents with chief complaint of forearm laceration and elbow pain.  Initial DDx: Laceration, elbow fracture Soft tissue contusion  Plan:  Laceration repair Tetanus update Ativan for anxiety X-ray of elbow  ED  Summary:  Patient underwent the above work-up which did not show evidence of fracture.  Her tetanus was updated and she had her laceration repaired.  Please see procedure note for additional details.  She was instructed to keep her stitches in for 8 to 10 days and have the removed in the emergency department or primary care doctor's office.  Counseled the patient on the symptoms of infection and proper wound care.  Return  precautions discussed prior to discharge.  Final Clinical Impression(s) / ED Diagnoses Final diagnoses:  Laceration of left forearm, initial encounter    Rx / DC Orders ED Discharge Orders     None         Fransico Meadow, MD 11/24/21 [phone]

## 2021-11-24 NOTE — Discharge Instructions (Signed)
Please return to have your stitches removed in 8 to 10 days to our emergency departments, urgent care, or primary doctor.

## 2022-06-09 ENCOUNTER — Emergency Department (HOSPITAL_COMMUNITY)
Admission: EM | Admit: 2022-06-09 | Discharge: 2022-06-09 | Disposition: A | Discharge status: Home / Self Care | Payer: Self-pay | Attending: Emergency Medicine | Admitting: Emergency Medicine

## 2022-06-09 ENCOUNTER — Other Ambulatory Visit: Payer: Self-pay

## 2022-06-09 ENCOUNTER — Encounter (HOSPITAL_COMMUNITY): Payer: Self-pay

## 2022-06-09 DIAGNOSIS — L02416 Cutaneous abscess of left lower limb: Secondary | ICD-10-CM | POA: Insufficient documentation

## 2022-06-09 MED ORDER — OXYCODONE-ACETAMINOPHEN 5-325 MG PO TABS: 1.0000 | ORAL_TABLET | Freq: Four times a day (QID) | ORAL | 0 refills | Status: AC | PRN

## 2022-06-09 MED ORDER — DOXYCYCLINE HYCLATE 100 MG PO CAPS: 100.0000 mg | ORAL_CAPSULE | Freq: Two times a day (BID) | ORAL | 0 refills | Status: DC

## 2022-06-09 MED ORDER — DOXYCYCLINE HYCLATE 100 MG PO CAPS: 100.0000 mg | ORAL_CAPSULE | Freq: Two times a day (BID) | ORAL | 0 refills | Status: AC

## 2022-06-09 MED ORDER — LIDOCAINE HCL (PF) 1 % IJ SOLN
INTRAMUSCULAR | Status: AC
  Filled 2022-06-09: qty 30

## 2022-06-09 MED ORDER — OXYCODONE-ACETAMINOPHEN 5-325 MG PO TABS: 1.0000 | ORAL_TABLET | Freq: Four times a day (QID) | ORAL | 0 refills | Status: DC | PRN

## 2022-06-09 MED ORDER — DOXYCYCLINE HYCLATE 100 MG PO TABS
100.0000 mg | ORAL_TABLET | Freq: Once | ORAL | Status: AC
  Administered 2022-06-09: 100 mg via ORAL
  Filled 2022-06-09: qty 1

## 2022-06-09 MED ORDER — OXYCODONE-ACETAMINOPHEN 5-325 MG PO TABS
2.0000 | ORAL_TABLET | Freq: Once | ORAL | Status: AC
  Administered 2022-06-09: 2 via ORAL
  Filled 2022-06-09: qty 2

## 2022-06-09 NOTE — Discharge Instructions (Signed)
Begin taking doxycycline as prescribed.  Begin taking Percocet as prescribed as needed for pain.  Apply warm compresses or perform sitz bath's as frequently as possible for the next 3 days.  Return to the emergency department if symptoms significantly worsen or change.

## 2022-06-09 NOTE — ED Provider Notes (Signed)
[place] Provider Note   CSN: [id number] Arrival date & time: 06/09/22  0209     History  Chief Complaint  Patient presents with   Abscess    [name] is a 32 y.o. female.  Patient is a 32 year old female presenting with complaints of pain and swelling just below her of the left buttock.  This has been worsening over the past week.  She denies any fevers or chills.  She denies injury or trauma.  Pain is worse with movement and palpation with no alleviating factors.  The history is provided by the patient.       Home Medications Prior to Admission medications   Medication Sig Start Date End Date Taking? Authorizing Provider  buprenorphine (SUBUTEX) 8 MG SUBL SL tablet DISSOLVE 1/4 film ('2mg'$ )  UNDER THE TONGUE EVERY 8 HOURS 10/21/16   [provider]  ibuprofen (ADVIL,MOTRIN) 600 MG tablet Take 1 tablet (600 mg total) by mouth every 6 (six) hours. Patient not taking: Reported on 05/11/2017 03/25/17   Cresenzo-Dishmon, Joaquim Lai, CNM  phenylephrine (,USE FOR PREPARATION-H,) 0.25 % suppository Place 1 suppository rectally 2 (two) times daily. 06/21/17   Cresenzo-Dishmon, Joaquim Lai, CNM  Prenatal Vit-Fe Fumarate-FA (PRENATAL MULTIVITAMIN) TABS tablet Take 1 tablet by mouth. Takes vit 3 times weekly    [provider]      Allergies    Patient has no known allergies.    Review of Systems   Review of Systems  All other systems reviewed and are negative.   Physical Exam Updated Vital Signs BP 136/84 (BP Location: Left Arm)   Pulse 90   Temp 98.4 F (36.9 C) (Oral)   Resp 19   Wt 68 kg   SpO2 100%   BMI 24.95 kg/m  Physical Exam Vitals and nursing note reviewed.  Constitutional:      Appearance: Normal appearance.  HENT:     Head: Normocephalic and atraumatic.  Pulmonary:     Effort: Pulmonary effort is normal.  Skin:    General: Skin is warm and dry.     Comments: Just below the left buttock to the  upper left posterior thigh is a 4 cm x 6 cm erythematous area with induration of the skin and fluctuance consistent with abscess.  Neurological:     Mental Status: She is alert.     ED Results / Procedures / Treatments   Labs (all labs ordered are listed, but only abnormal results are displayed) Labs Reviewed - No data to display  EKG None  Radiology No results found.  Procedures Procedures  INCISION AND DRAINAGE Performed by: Veryl Speak Consent: Verbal consent obtained. Risks and benefits: risks, benefits and alternatives were discussed Type: abscess  Body area: left thigh/buttock  Anesthesia: local infiltration  Incision was made with a scalpel.  Local anesthetic: lidocaine 1% without epinephrine  Anesthetic total: 3 ml  Complexity: complex Blunt dissection to break up loculations  Drainage: purulent  Drainage amount: moderate  Packing material: no packing placed  Patient tolerance: Patient tolerated the procedure well with no immediate complications.    Medications Ordered in ED Medications  lidocaine (PF) (XYLOCAINE) 1 % injection (has no administration in time range)  oxyCODONE-acetaminophen (PERCOCET/ROXICET) 5-325 MG per tablet 2 tablet (has no administration in time range)  doxycycline (VIBRA-TABS) tablet 100 mg (has no administration in time range)    ED Course/ Medical Decision Making/ A&P  Patient presenting with an abscess to the left upper  thigh/buttock.  This area was incised and drained as above.  Patient to be treated with doxycycline, pain medication, warm compresses, and follow-up as needed.  Final Clinical Impression(s) / ED Diagnoses Final diagnoses:  None    Rx / DC Orders ED Discharge Orders     None         Veryl Speak, MD 06/09/22 [phone]

## 2022-06-09 NOTE — ED Triage Notes (Signed)
Pt presents with "spider bite" to left buttocks area. Has gotten worse over last few days. Area is red and swollen.

## 2023-02-13 ENCOUNTER — Ambulatory Visit (HOSPITAL_COMMUNITY)
Admission: EM | Admit: 2023-02-13 | Discharge: 2023-02-13 | Disposition: A | Discharge status: Home / Self Care | Payer: Self-pay | Attending: Behavioral Health | Admitting: Behavioral Health

## 2023-02-13 DIAGNOSIS — F17200 Nicotine dependence, unspecified, uncomplicated: Secondary | ICD-10-CM | POA: Insufficient documentation

## 2023-02-13 DIAGNOSIS — F1490 Cocaine use, unspecified, uncomplicated: Secondary | ICD-10-CM | POA: Insufficient documentation

## 2023-02-13 NOTE — Progress Notes (Signed)
   02/13/23 1036  [facility] Triage Screening (Walk-ins at [facility] only)  How Did You Hear About [place]? Family/Friend  What Is the Reason for Your Visit/Call Today? [name] is a 32 year old female presenting to [facility] accompanied by her father. Pt reports she has used cocaine for 7 years, but reports that she has been clean for 4 of the years out of 7. Pt reports she last used .02 of cocaine yesterday. Pt also reports she uses cocaine roughly 3 times a week on average. Pt mentions she is here looking to detox off of coacine. Pt has not been to an inpatient treatment center before. Pt currently does not have any known mental health disorders at this time. Pt also denies taking any medications or seeing a therapist. Pt denies alcohol use, SI, HI and AVH.  How Long Has This Been Causing You Problems? > than 6 months  Have You Recently Had Any Thoughts About Hurting Yourself? No  Are You Planning to Commit Suicide/Harm Yourself At This time? No  Have you Recently Had Thoughts About Hurting Someone Karolee Ohs? No  Are You Planning To Harm Someone At This Time? No  Are you currently experiencing any auditory, visual or other hallucinations? No  Have You Used Any Alcohol or Drugs in the Past 24 Hours? Yes  How long ago did you use Drugs or Alcohol? yesterday, cocaine  What Did You Use and How Much? .02  Do you have any current medical co-morbidities that require immediate attention? No  Clinician description of patient physical appearance/behavior: calm, cooperative  What Do You Feel Would Help You the Most Today? Medication(s);Alcohol or Drug Use Treatment  If access to [facility] was not available, would you have sought care in the Emergency Department? No  Determination of Need Urgent (48 hours)  Options For Referral Medication Management;Facility-Based Crisis

## 2023-02-13 NOTE — Discharge Instructions (Signed)
Discharge recommendations:   Substance abuse: Please review list of outpatient resources for substance counseling and residential services.   Therapy: We recommend that patient participate in individual therapy to address mental health concerns.  Safety:   The following safety precautions should be taken:   No sharp objects. This includes scissors, razors, scrapers, and putty knives.   Chemicals should be removed and locked up.   Medications should be removed and locked up.   Weapons should be removed and locked up. This includes firearms, knives and instruments that can be used to cause injury.   The patient should abstain from use of illicit substances/drugs and abuse of any medications.  If symptoms worsen or do not continue to improve or if the patient becomes actively suicidal or homicidal then it is recommended that the patient return to the closest hospital emergency department, the [facility], or call 911 for further evaluation and treatment. National Suicide Prevention Lifeline 1-800-SUICIDE or 220 205 8884.  About 988 988 offers 24/7 access to trained crisis counselors who can help people experiencing mental health-related distress. People can call or text 988 or chat 988lifeline.org for themselves or if they are worried about a loved one who may need crisis support.

## 2023-02-13 NOTE — ED Provider Notes (Signed)
Behavioral Health Urgent Care Medical Screening Exam  Patient Name: [name] MRN: [id number] Date of Evaluation: 02/13/23 Chief Complaint:  outpatient treatment for cocaine use Diagnosis:  Final diagnoses:  Cocaine use    History of Present illness: [name] is a 32 y.o. female patient with no past psychiatric history who presents to the [facility] behavioral health urgent care voluntary accompanied by her father seeking substance abuse treatment for cocaine use.  Patient states that she has been using cocaine intermittently for the past 7 years. She reports a sobriety between 2019-2022. On average, she is currently smoking cocaine 3 times per week, an unknown quantity and states that she last used yesterday. She denies using other illicit drugs. She denies drinking alcohol. She denies past inpatient substance abuse treatment. She states that she is looking for outpatient services for substance abuse.  On evaluation, patient is alert and oriented x 4. Her thought process is linear and goal oriented. Her speech is clear and coherent. Her mood is dysphoric and affect is congruent. She denies SI/HI/AVH. There is no objective evidence that the patient is currently responding to internal or external stimuli. She reports poor sleep. She reports a fair appetite. She states that she uses cocaine to deal with emotional things and that the holidays are especially hard for her because she misses her kids. She states that she has 7 kids ages 48, 34, 52, 13, 62, 52, and 79 and that her 3 youngest children were adopted.   Plan of care: Patient recommended to follow up with outpatient counseling for substance abuse treatment. Patient also encouraged to consider residential treatment for long-term treatment to maintain sobriety. Patient provided with a list of resources for outpatient and residential substance abuse treatment options. Patient safely discharged.  Flowsheet Row ED from 06/09/2022 in [facility] Emergency Department at [facility] ED from 11/24/2021 in [facility] Emergency Department at [facility] ED from 05/06/2021 in [facility] Emergency Department at [facility]  C-SSRS RISK CATEGORY No Risk No Risk No Risk       Psychiatric Specialty Exam  Presentation  General Appearance:Appropriate for Environment  Eye Contact:Fair  Speech:Clear and Coherent  Speech Volume:Normal  Handedness:Right   Mood and Affect  Mood: Dysphoric  Affect: Congruent   Thought Process  Thought Processes: Coherent; Goal Directed  Descriptions of Associations:Intact  Orientation:Full (Time, Place and Person)  Thought Content:Logical    Hallucinations:None  Ideas of Reference:None  Suicidal Thoughts:No  Homicidal Thoughts:No   Sensorium  Memory: Immediate Fair; Remote Fair; Recent Fair  Judgment: Fair  Insight: Fair   Art therapist  Concentration: Fair  Attention Span: Fair  Recall: [employer] of Knowledge: Fair  Language: Fair   Psychomotor Activity  Psychomotor Activity: Normal   Assets  Assets: Manufacturing systems engineer; Desire for Improvement; Housing; Health and safety inspector; Leisure Time; Physical Health   Sleep  Sleep: Poor   Physical Exam: Physical Exam HENT:     Nose: Nose normal.  Eyes:     Conjunctiva/sclera: Conjunctivae normal.  Cardiovascular:     Rate and Rhythm: Normal rate.  Pulmonary:     Effort: Pulmonary effort is normal.  Musculoskeletal:        General: Normal range of motion.     Cervical back: Normal range of motion.  Neurological:     Mental Status: She is alert and oriented to person, place, and time.    Review of Systems  Constitutional: Negative.   HENT:  Negative.    Eyes: Negative.   Respiratory: Negative.    Cardiovascular: Negative.   Gastrointestinal: Negative.   Genitourinary: Negative.   Musculoskeletal: Negative.   Neurological: Negative.    Endo/Heme/Allergies: Negative.   Psychiatric/Behavioral:  Positive for substance abuse.    Blood pressure (!) 133/90, pulse 95, temperature 98.7 F (37.1 C), temperature source Oral, resp. rate 20, SpO2 100%. There is no height or weight on file to calculate BMI.  Musculoskeletal: Strength & Muscle Tone: within normal limits Gait & Station: normal Patient leans: N/A   [facility] MSE Discharge Disposition for Follow up and Recommendations: Based on my evaluation the patient does not appear to have an emergency medical condition and can be discharged with resources and follow up care in outpatient services for substance abuse treatment.  Discharge recommendations:   Substance abuse: Please review list of outpatient resources for substance counseling and residential services.   Therapy: We recommend that patient participate in individual therapy to address mental health concerns.  Safety:   The following safety precautions should be taken:   No sharp objects. This includes scissors, razors, scrapers, and putty knives.   Chemicals should be removed and locked up.   Medications should be removed and locked up.   Weapons should be removed and locked up. This includes firearms, knives and instruments that can be used to cause injury.   The patient should abstain from use of illicit substances/drugs and abuse of any medications.  If symptoms worsen or do not continue to improve or if the patient becomes actively suicidal or homicidal then it is recommended that the patient return to the closest hospital emergency department, the [facility], or call 911 for further evaluation and treatment. National Suicide Prevention Lifeline 1-800-SUICIDE or 8061263759.  About 988 988 offers 24/7 access to trained crisis counselors who can help people experiencing mental health-related distress. People can call or text 988 or chat 988lifeline.org for themselves or if they  are worried about a loved one who may need crisis support.    Elinda Bunten L, NP 02/13/2023, 11:15 AM
